# Patient Record
Sex: Male | Born: 1985 | Race: Black or African American | Hispanic: No | Marital: Single | Smoking: Current some day smoker
Health system: Southern US, Community
[De-identification: ages and names within clinical notes are randomized; demographics above are authoritative.]

## PROBLEM LIST (undated history)

## (undated) HISTORY — PX: FINGER AMPUTATION: SHX636

---
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
## 2005-02-26 ENCOUNTER — Emergency Department (HOSPITAL_COMMUNITY): Admission: EM | Admit: 2005-02-26 | Discharge: 2005-02-26 | Payer: Self-pay | Admitting: Emergency Medicine

## 2005-02-27 ENCOUNTER — Inpatient Hospital Stay (HOSPITAL_COMMUNITY): Admission: EM | Admit: 2005-02-27 | Discharge: 2005-02-28 | Payer: Self-pay | Admitting: Emergency Medicine

## 2005-04-07 ENCOUNTER — Ambulatory Visit (HOSPITAL_COMMUNITY): Admission: RE | Admit: 2005-04-07 | Discharge: 2005-04-07 | Payer: Self-pay | Admitting: Otolaryngology

## 2005-04-19 ENCOUNTER — Ambulatory Visit (HOSPITAL_COMMUNITY): Admission: RE | Admit: 2005-04-19 | Discharge: 2005-04-19 | Payer: Self-pay | Admitting: Otolaryngology

## 2005-04-19 ENCOUNTER — Ambulatory Visit (HOSPITAL_BASED_OUTPATIENT_CLINIC_OR_DEPARTMENT_OTHER): Admission: RE | Admit: 2005-04-19 | Discharge: 2005-04-19 | Payer: Self-pay | Admitting: Otolaryngology

## 2005-06-30 ENCOUNTER — Emergency Department (HOSPITAL_COMMUNITY): Admission: EM | Admit: 2005-06-30 | Discharge: 2005-06-30 | Payer: Self-pay | Admitting: Family Medicine

## 2006-09-12 ENCOUNTER — Emergency Department (HOSPITAL_COMMUNITY): Admission: EM | Admit: 2006-09-12 | Discharge: 2006-09-12 | Payer: Self-pay | Admitting: Emergency Medicine

## 2006-09-13 ENCOUNTER — Emergency Department (HOSPITAL_COMMUNITY): Admission: EM | Admit: 2006-09-13 | Discharge: 2006-09-13 | Payer: Self-pay | Admitting: Emergency Medicine

## 2007-09-19 ENCOUNTER — Emergency Department (HOSPITAL_COMMUNITY): Admission: EM | Admit: 2007-09-19 | Discharge: 2007-09-19 | Payer: Self-pay | Admitting: Emergency Medicine

## 2007-09-21 ENCOUNTER — Ambulatory Visit (HOSPITAL_COMMUNITY): Admission: RE | Admit: 2007-09-21 | Discharge: 2007-09-21 | Payer: Self-pay | Admitting: Orthopaedic Surgery

## 2007-09-26 ENCOUNTER — Emergency Department (HOSPITAL_COMMUNITY): Admission: EM | Admit: 2007-09-26 | Discharge: 2007-09-26 | Payer: Self-pay | Admitting: *Deleted

## 2008-05-04 ENCOUNTER — Emergency Department (HOSPITAL_COMMUNITY): Admission: EM | Admit: 2008-05-04 | Discharge: 2008-05-04 | Payer: Self-pay | Admitting: Emergency Medicine

## 2009-06-24 ENCOUNTER — Observation Stay (HOSPITAL_COMMUNITY): Admission: AC | Admit: 2009-06-24 | Discharge: 2009-06-25 | Payer: Self-pay

## 2011-02-20 LAB — PROTIME-INR
INR: 1 (ref 0.00–1.49)
Prothrombin Time: 12.6 seconds (ref 11.6–15.2)

## 2011-02-20 LAB — TYPE AND SCREEN
ABO/RH(D): AB POS
Antibody Screen: NEGATIVE

## 2011-02-20 LAB — POCT I-STAT, CHEM 8
BUN: 5 mg/dL — ABNORMAL LOW (ref 6–23)
Calcium, Ion: 1.1 mmol/L — ABNORMAL LOW (ref 1.12–1.32)
Chloride: 103 meq/L (ref 96–112)
Creatinine, Ser: 1.2 mg/dL (ref 0.4–1.5)
Glucose, Bld: 115 mg/dL — ABNORMAL HIGH (ref 70–99)
HCT: 48 % (ref 39.0–52.0)
Hemoglobin: 16.3 g/dL (ref 13.0–17.0)
Potassium: 3 meq/L — ABNORMAL LOW (ref 3.5–5.1)
Sodium: 140 meq/L (ref 135–145)
TCO2: 24 mmol/L (ref 0–100)

## 2011-02-20 LAB — CBC
HCT: 45.8 % (ref 39.0–52.0)
HCT: 45.9 % (ref 39.0–52.0)
Hemoglobin: 15.4 g/dL (ref 13.0–17.0)
Hemoglobin: 15.8 g/dL (ref 13.0–17.0)
MCHC: 33.7 g/dL (ref 30.0–36.0)
MCHC: 34.3 g/dL (ref 30.0–36.0)
MCV: 86 fL (ref 78.0–100.0)
MCV: 88.2 fL (ref 78.0–100.0)
Platelets: 278 10*3/uL (ref 150–400)
Platelets: 282 10*3/uL (ref 150–400)
RBC: 5.2 MIL/uL (ref 4.22–5.81)
RBC: 5.34 MIL/uL (ref 4.22–5.81)
RDW: 13.9 % (ref 11.5–15.5)
RDW: 13.9 % (ref 11.5–15.5)
WBC: 11.1 10*3/uL — ABNORMAL HIGH (ref 4.0–10.5)
WBC: 7.6 10*3/uL (ref 4.0–10.5)

## 2011-02-20 LAB — ABO/RH: ABO/RH(D): AB POS

## 2011-02-20 LAB — LACTIC ACID, PLASMA: Lactic Acid, Venous: 2.9 mmol/L — ABNORMAL HIGH (ref 0.5–2.2)
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
## 2011-08-24 LAB — CBC
HCT: 47.4
Hemoglobin: 15.8
MCHC: 33.4
MCV: 86.4
Platelets: 336
RBC: 5.49
RDW: 13.8
WBC: 8.1

## 2017-06-10 ENCOUNTER — Emergency Department (HOSPITAL_COMMUNITY): Payer: Self-pay

## 2017-06-10 ENCOUNTER — Encounter (HOSPITAL_COMMUNITY): Payer: Self-pay | Admitting: Emergency Medicine

## 2017-06-10 ENCOUNTER — Emergency Department (HOSPITAL_COMMUNITY)
Admission: EM | Admit: 2017-06-10 | Discharge: 2017-06-10 | Disposition: A | Discharge status: Other Acute Inpatient Hospital | Payer: Self-pay | Attending: Emergency Medicine | Admitting: Emergency Medicine

## 2017-06-10 DIAGNOSIS — W228XXA Striking against or struck by other objects, initial encounter: Secondary | ICD-10-CM | POA: Insufficient documentation

## 2017-06-10 DIAGNOSIS — S81801A Unspecified open wound, right lower leg, initial encounter: Secondary | ICD-10-CM | POA: Insufficient documentation

## 2017-06-10 DIAGNOSIS — Y9389 Activity, other specified: Secondary | ICD-10-CM | POA: Insufficient documentation

## 2017-06-10 DIAGNOSIS — Y929 Unspecified place or not applicable: Secondary | ICD-10-CM | POA: Insufficient documentation

## 2017-06-10 DIAGNOSIS — W3400XA Accidental discharge from unspecified firearms or gun, initial encounter: Secondary | ICD-10-CM

## 2017-06-10 DIAGNOSIS — Y248XXA Other firearm discharge, undetermined intent, initial encounter: Secondary | ICD-10-CM | POA: Insufficient documentation

## 2017-06-10 DIAGNOSIS — Y999 Unspecified external cause status: Secondary | ICD-10-CM | POA: Insufficient documentation

## 2017-06-10 LAB — I-STAT CG4 LACTIC ACID, ED: Lactic Acid, Venous: 4.43 mmol/L (ref 0.5–1.9)

## 2017-06-10 LAB — I-STAT CHEM 8, ED
BUN: 5 mg/dL — AB (ref 6–20)
CREATININE: 1 mg/dL (ref 0.61–1.24)
Calcium, Ion: 1.04 mmol/L — ABNORMAL LOW (ref 1.15–1.40)
Chloride: 103 mmol/L (ref 101–111)
Glucose, Bld: 141 mg/dL — ABNORMAL HIGH (ref 65–99)
HEMATOCRIT: 48 % (ref 39.0–52.0)
HEMOGLOBIN: 16.3 g/dL (ref 13.0–17.0)
POTASSIUM: 3 mmol/L — AB (ref 3.5–5.1)
Sodium: 139 mmol/L (ref 135–145)
TCO2: 22 mmol/L (ref 0–100)

## 2017-06-10 LAB — COMPREHENSIVE METABOLIC PANEL
ALT: 20 U/L (ref 17–63)
AST: 22 U/L (ref 15–41)
Albumin: 4.3 g/dL (ref 3.5–5.0)
Alkaline Phosphatase: 78 U/L (ref 38–126)
Anion gap: 13 (ref 5–15)
BILIRUBIN TOTAL: 0.4 mg/dL (ref 0.3–1.2)
BUN: 6 mg/dL (ref 6–20)
CHLORIDE: 103 mmol/L (ref 101–111)
CO2: 21 mmol/L — ABNORMAL LOW (ref 22–32)
CREATININE: 1.1 mg/dL (ref 0.61–1.24)
Calcium: 9.3 mg/dL (ref 8.9–10.3)
GFR calc Af Amer: >60 mL/min (ref >60)
Glucose, Bld: 144 mg/dL — ABNORMAL HIGH (ref 65–99)
Potassium: 3 mmol/L — ABNORMAL LOW (ref 3.5–5.1)
Sodium: 137 mmol/L (ref 135–145)
Total Protein: 7.3 g/dL (ref 6.5–8.1)

## 2017-06-10 LAB — PROTIME-INR
INR: 0.98
Prothrombin Time: 13 seconds (ref 11.4–15.2)

## 2017-06-10 LAB — CBC
HCT: 45.4 % (ref 39.0–52.0)
Hemoglobin: 16.2 g/dL (ref 13.0–17.0)
MCH: 30.1 pg (ref 26.0–34.0)
MCHC: 35.7 g/dL (ref 30.0–36.0)
MCV: 84.4 fL (ref 78.0–100.0)
PLATELETS: 317 10*3/uL (ref 150–400)
RBC: 5.38 MIL/uL (ref 4.22–5.81)
RDW: 13.1 % (ref 11.5–15.5)
WBC: 9.5 10*3/uL (ref 4.0–10.5)

## 2017-06-10 LAB — SAMPLE TO BLOOD BANK

## 2017-06-10 LAB — ETHANOL: Alcohol, Ethyl (B): 62 mg/dL — ABNORMAL HIGH (ref <5)

## 2017-06-10 LAB — CDS SEROLOGY

## 2017-06-10 MED ORDER — HYDROMORPHONE HCL 1 MG/ML IJ SOLN
1.0000 mg | Freq: Once | INTRAMUSCULAR | Status: AC
Start: 2017-06-10 — End: 2017-06-10
  Administered 2017-06-10: 1 mg via INTRAVENOUS
  Filled 2017-06-10: qty 1

## 2017-06-10 MED ORDER — FENTANYL CITRATE (PF) 100 MCG/2ML IJ SOLN
INTRAMUSCULAR | Status: AC
  Filled 2017-06-10: qty 2

## 2017-06-10 MED ORDER — FENTANYL CITRATE (PF) 100 MCG/2ML IJ SOLN
100.0000 ug | Freq: Once | INTRAMUSCULAR | Status: AC
  Administered 2017-06-10: 100 ug via INTRAVENOUS

## 2017-06-10 MED ORDER — LIDOCAINE-EPINEPHRINE (PF) 2 %-1:200000 IJ SOLN
10.0000 mL | Freq: Once | INTRAMUSCULAR | Status: AC
  Administered 2017-06-10: 10 mL
  Filled 2017-06-10: qty 20

## 2017-06-10 MED ORDER — MORPHINE SULFATE (PF) 4 MG/ML IV SOLN
4.0000 mg | INTRAVENOUS | Status: DC | PRN
  Administered 2017-06-10: 4 mg via INTRAVENOUS
  Filled 2017-06-10: qty 1

## 2017-06-10 MED ORDER — TETANUS-DIPHTH-ACELL PERTUSSIS 5-2.5-18.5 LF-MCG/0.5 IM SUSP
0.5000 mL | Freq: Once | INTRAMUSCULAR | Status: AC
  Administered 2017-06-10: 0.5 mL via INTRAMUSCULAR
  Filled 2017-06-10: qty 0.5

## 2017-06-10 MED ORDER — NICOTINE 21 MG/24HR TD PT24
21.0000 mg | MEDICATED_PATCH | Freq: Every day | TRANSDERMAL | Status: DC
  Administered 2017-06-10: 21 mg via TRANSDERMAL
  Filled 2017-06-10: qty 1

## 2017-06-10 MED ORDER — ONDANSETRON HCL 4 MG/2ML IJ SOLN
4.0000 mg | Freq: Once | INTRAMUSCULAR | Status: AC
  Administered 2017-06-10: 4 mg via INTRAVENOUS
  Filled 2017-06-10: qty 2

## 2017-06-10 MED ORDER — SODIUM CHLORIDE 0.9 % IV BOLUS (SEPSIS)
1000.0000 mL | Freq: Once | INTRAVENOUS | Status: AC
Start: 2017-06-10 — End: 2017-06-10
  Administered 2017-06-10: 1000 mL via INTRAVENOUS

## 2017-06-10 MED ORDER — CEFAZOLIN SODIUM-DEXTROSE 2-4 GM/100ML-% IV SOLN
2.0000 g | Freq: Once | INTRAVENOUS | Status: AC
  Administered 2017-06-10: 2 g via INTRAVENOUS
  Filled 2017-06-10: qty 100

## 2017-06-10 NOTE — ED Notes (Signed)
REPORT GIVEN TO Burnice LoganONALD OWENS, RN AT [facility]. RN informed [facility] of [facility] for [facility].

## 2017-06-10 NOTE — ED Notes (Signed)
Hand MD at bedside

## 2017-06-10 NOTE — ED Provider Notes (Signed)
Accepted by Dr[facility] at [facility] - Dr. Merlyn LotKuzma arranged transfer Pt informed and stable for transfer.   Eber HongMiller, Stryder Poitra, MD 06/10/17 [phone]
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
## 2017-06-10 NOTE — ED Notes (Signed)
Pt requesting pain meds. MD informed

## 2017-06-10 NOTE — ED Notes (Signed)
Tech, Ida, changed patient's dressings and cleaned room and changed linens in bed.
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
## 2017-06-10 NOTE — ED Notes (Signed)
RN called ortho tech 

## 2017-06-10 NOTE — Discharge Instructions (Signed)
Do not put any weight on your left foot/leg until you see Dr. Aundria Rudogers in the office in two weeks.
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
## 2017-06-10 NOTE — Progress Notes (Signed)
Orthopedic Tech Progress Note Patient Details:  [name] 12-Aug-1986 [id number]  Ortho Devices Type of Ortho Device: Post (short leg) splint, Volar splint Ortho Device/Splint Location: confirmed with floor nurse. pt needed left short leg post splint.  applied short leg post splint on pt left lower leg.    Hand surgeon requested a volar on pt left hand.  applied volar splint on pt left hand.  Pt tolerated both applications very well.  Ortho Device/Splint Interventions: Application   Alvina ChouWilliams, Dianna Ewald C 06/10/2017, 9:19 AM
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
## 2017-11-15 DIAGNOSIS — Y249XXA Unspecified firearm discharge, undetermined intent, initial encounter: Secondary | ICD-10-CM

## 2017-11-15 DIAGNOSIS — W3400XA Accidental discharge from unspecified firearms or gun, initial encounter: Secondary | ICD-10-CM

## 2017-11-15 HISTORY — DX: Accidental discharge from unspecified firearms or gun, initial encounter: W34.00XA

## 2017-11-15 HISTORY — DX: Unspecified firearm discharge, undetermined intent, initial encounter: Y24.9XXA

## 2019-05-08 ENCOUNTER — Encounter (HOSPITAL_COMMUNITY): Payer: Self-pay | Admitting: Emergency Medicine

## 2019-05-08 ENCOUNTER — Emergency Department (HOSPITAL_COMMUNITY): Payer: Self-pay

## 2019-05-08 ENCOUNTER — Other Ambulatory Visit: Payer: Self-pay

## 2019-05-08 ENCOUNTER — Emergency Department (HOSPITAL_COMMUNITY)
Admission: EM | Admit: 2019-05-08 | Discharge: 2019-05-09 | Disposition: A | Discharge status: Home / Self Care | Payer: Self-pay | Attending: Emergency Medicine | Admitting: Emergency Medicine

## 2019-05-08 DIAGNOSIS — Y9289 Other specified places as the place of occurrence of the external cause: Secondary | ICD-10-CM | POA: Insufficient documentation

## 2019-05-08 DIAGNOSIS — Y9389 Activity, other specified: Secondary | ICD-10-CM | POA: Insufficient documentation

## 2019-05-08 DIAGNOSIS — Y999 Unspecified external cause status: Secondary | ICD-10-CM | POA: Insufficient documentation

## 2019-05-08 DIAGNOSIS — S71101A Unspecified open wound, right thigh, initial encounter: Secondary | ICD-10-CM | POA: Insufficient documentation

## 2019-05-08 DIAGNOSIS — W3400XA Accidental discharge from unspecified firearms or gun, initial encounter: Secondary | ICD-10-CM | POA: Insufficient documentation

## 2019-05-08 LAB — CBC WITH DIFFERENTIAL/PLATELET
Abs Immature Granulocytes: 0.03 10*3/uL (ref 0.00–0.07)
Basophils Absolute: 0.1 10*3/uL (ref 0.0–0.1)
Basophils Relative: 1 %
Eosinophils Absolute: 0.1 10*3/uL (ref 0.0–0.5)
Eosinophils Relative: 1 %
HCT: 45.3 % (ref 39.0–52.0)
Hemoglobin: 15.8 g/dL (ref 13.0–17.0)
Immature Granulocytes: 0 %
Lymphocytes Relative: 36 %
Lymphs Abs: 3 10*3/uL (ref 0.7–4.0)
MCH: 30.3 pg (ref 26.0–34.0)
MCHC: 34.9 g/dL (ref 30.0–36.0)
MCV: 86.8 fL (ref 80.0–100.0)
Monocytes Absolute: 0.9 10*3/uL (ref 0.1–1.0)
Monocytes Relative: 11 %
Neutro Abs: 4.3 10*3/uL (ref 1.7–7.7)
Neutrophils Relative %: 51 %
Platelets: 328 10*3/uL (ref 150–400)
RBC: 5.22 MIL/uL (ref 4.22–5.81)
RDW: 13.4 % (ref 11.5–15.5)
WBC: 8.3 10*3/uL (ref 4.0–10.5)
nRBC: 0 % (ref 0.0–0.2)

## 2019-05-08 LAB — BASIC METABOLIC PANEL
Anion gap: 10 (ref 5–15)
BUN: 7 mg/dL (ref 6–20)
CO2: 23 mmol/L (ref 22–32)
Calcium: 9.6 mg/dL (ref 8.9–10.3)
Chloride: 106 mmol/L (ref 98–111)
Creatinine, Ser: 1.05 mg/dL (ref 0.61–1.24)
GFR calc Af Amer: >60 mL/min (ref >60)
GFR calc non Af Amer: >60 mL/min (ref >60)
Glucose, Bld: 109 mg/dL — ABNORMAL HIGH (ref 70–99)
Potassium: 3.7 mmol/L (ref 3.5–5.1)
Sodium: 139 mmol/L (ref 135–145)

## 2019-05-08 LAB — ETHANOL: Alcohol, Ethyl (B): 99 mg/dL — ABNORMAL HIGH (ref <10)

## 2019-05-08 MED ORDER — CEFAZOLIN SODIUM-DEXTROSE 1-4 GM/50ML-% IV SOLN
1.0000 g | Freq: Once | INTRAVENOUS | Status: AC
  Administered 2019-05-08: 1 g via INTRAVENOUS
  Filled 2019-05-08: qty 50

## 2019-05-08 MED ORDER — SODIUM CHLORIDE 0.9 % IV BOLUS
1000.0000 mL | Freq: Once | INTRAVENOUS | Status: AC
  Administered 2019-05-08: 1000 mL via INTRAVENOUS

## 2019-05-08 MED ORDER — MORPHINE SULFATE (PF) 4 MG/ML IV SOLN
4.0000 mg | Freq: Once | INTRAVENOUS | Status: AC
  Administered 2019-05-08: 4 mg via INTRAVENOUS
  Filled 2019-05-08: qty 1
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
## 2019-05-08 NOTE — ED Notes (Signed)
Levada Dy Lumpkin pts mother [phone]

## 2019-05-09 MED ORDER — IBUPROFEN 600 MG PO TABS: 600.0000 mg | ORAL_TABLET | Freq: Four times a day (QID) | ORAL | 0 refills | Status: DC | PRN

## 2019-05-09 MED ORDER — MORPHINE SULFATE (PF) 4 MG/ML IV SOLN
4.0000 mg | Freq: Once | INTRAVENOUS | Status: AC
  Administered 2019-05-09: 4 mg via INTRAVENOUS

## 2019-05-09 MED ORDER — MORPHINE SULFATE (PF) 4 MG/ML IV SOLN
INTRAVENOUS | Status: AC
  Filled 2019-05-09: qty 1

## 2019-05-09 MED ORDER — OXYCODONE-ACETAMINOPHEN 5-325 MG PO TABS: 1.0000 | ORAL_TABLET | Freq: Four times a day (QID) | ORAL | 0 refills | Status: DC | PRN

## 2019-05-10 ENCOUNTER — Telehealth: Payer: Self-pay | Admitting: *Deleted
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
## 2020-02-24 ENCOUNTER — Observation Stay (HOSPITAL_COMMUNITY)
Admission: EM | Admit: 2020-02-24 | Discharge: 2020-02-25 | Disposition: A | Discharge status: Home / Self Care | Payer: No Typology Code available for payment source | Attending: Orthopedic Surgery | Admitting: Orthopedic Surgery

## 2020-02-24 ENCOUNTER — Emergency Department (HOSPITAL_COMMUNITY): Payer: No Typology Code available for payment source

## 2020-02-24 ENCOUNTER — Encounter (HOSPITAL_COMMUNITY)
Admission: EM | Disposition: A | Discharge status: Home / Self Care | Payer: Self-pay | Source: Home / Self Care | Attending: Emergency Medicine

## 2020-02-24 ENCOUNTER — Encounter (HOSPITAL_COMMUNITY): Payer: Self-pay | Admitting: Emergency Medicine

## 2020-02-24 ENCOUNTER — Other Ambulatory Visit: Payer: Self-pay

## 2020-02-24 DIAGNOSIS — F17200 Nicotine dependence, unspecified, uncomplicated: Secondary | ICD-10-CM | POA: Diagnosis not present

## 2020-02-24 DIAGNOSIS — Z79899 Other long term (current) drug therapy: Secondary | ICD-10-CM | POA: Diagnosis not present

## 2020-02-24 DIAGNOSIS — S80211A Abrasion, right knee, initial encounter: Secondary | ICD-10-CM | POA: Insufficient documentation

## 2020-02-24 DIAGNOSIS — S0081XA Abrasion of other part of head, initial encounter: Secondary | ICD-10-CM | POA: Insufficient documentation

## 2020-02-24 DIAGNOSIS — M25531 Pain in right wrist: Secondary | ICD-10-CM

## 2020-02-24 DIAGNOSIS — T1490XA Injury, unspecified, initial encounter: Secondary | ICD-10-CM

## 2020-02-24 DIAGNOSIS — S60512A Abrasion of left hand, initial encounter: Secondary | ICD-10-CM | POA: Insufficient documentation

## 2020-02-24 DIAGNOSIS — S82851B Displaced trimalleolar fracture of right lower leg, initial encounter for open fracture type I or II: Principal | ICD-10-CM | POA: Insufficient documentation

## 2020-02-24 DIAGNOSIS — Z20822 Contact with and (suspected) exposure to covid-19: Secondary | ICD-10-CM | POA: Insufficient documentation

## 2020-02-24 DIAGNOSIS — S80212A Abrasion, left knee, initial encounter: Secondary | ICD-10-CM | POA: Diagnosis not present

## 2020-02-24 DIAGNOSIS — S82891B Other fracture of right lower leg, initial encounter for open fracture type I or II: Secondary | ICD-10-CM | POA: Diagnosis present

## 2020-02-24 HISTORY — PX: EXTERNAL FIXATION LEG: SHX1549

## 2020-02-24 LAB — I-STAT CHEM 8, ED
BUN: 9 mg/dL (ref 6–20)
Calcium, Ion: 1.07 mmol/L — ABNORMAL LOW (ref 1.15–1.40)
Chloride: 105 mmol/L (ref 98–111)
Creatinine, Ser: 1.1 mg/dL (ref 0.61–1.24)
Glucose, Bld: 110 mg/dL — ABNORMAL HIGH (ref 70–99)
HCT: 48 % (ref 39.0–52.0)
Hemoglobin: 16.3 g/dL (ref 13.0–17.0)
Potassium: 3.8 mmol/L (ref 3.5–5.1)
Sodium: 139 mmol/L (ref 135–145)
TCO2: 24 mmol/L (ref 22–32)

## 2020-02-24 LAB — URINALYSIS, ROUTINE W REFLEX MICROSCOPIC
Bilirubin Urine: NEGATIVE
Glucose, UA: NEGATIVE mg/dL
Hgb urine dipstick: NEGATIVE
Ketones, ur: NEGATIVE mg/dL
Leukocytes,Ua: NEGATIVE
Nitrite: NEGATIVE
Protein, ur: NEGATIVE mg/dL
Specific Gravity, Urine: 1.033 — ABNORMAL HIGH (ref 1.005–1.030)
pH: 5 (ref 5.0–8.0)

## 2020-02-24 LAB — COMPREHENSIVE METABOLIC PANEL
ALT: 13 U/L (ref 0–44)
AST: 17 U/L (ref 15–41)
Albumin: 3.8 g/dL (ref 3.5–5.0)
Alkaline Phosphatase: 87 U/L (ref 38–126)
Anion gap: 16 — ABNORMAL HIGH (ref 5–15)
BUN: 8 mg/dL (ref 6–20)
CO2: 18 mmol/L — ABNORMAL LOW (ref 22–32)
Calcium: 8.9 mg/dL (ref 8.9–10.3)
Chloride: 102 mmol/L (ref 98–111)
Creatinine, Ser: 0.91 mg/dL (ref 0.61–1.24)
GFR calc Af Amer: >60 mL/min (ref >60)
GFR calc non Af Amer: >60 mL/min (ref >60)
Glucose, Bld: 114 mg/dL — ABNORMAL HIGH (ref 70–99)
Potassium: 3.9 mmol/L (ref 3.5–5.1)
Sodium: 136 mmol/L (ref 135–145)
Total Bilirubin: 0.6 mg/dL (ref 0.3–1.2)
Total Protein: 6.8 g/dL (ref 6.5–8.1)

## 2020-02-24 LAB — CBC
HCT: 44.6 % (ref 39.0–52.0)
Hemoglobin: 15.2 g/dL (ref 13.0–17.0)
MCH: 29.6 pg (ref 26.0–34.0)
MCHC: 34.1 g/dL (ref 30.0–36.0)
MCV: 86.9 fL (ref 80.0–100.0)
Platelets: 410 10*3/uL — ABNORMAL HIGH (ref 150–400)
RBC: 5.13 MIL/uL (ref 4.22–5.81)
RDW: 13.2 % (ref 11.5–15.5)
WBC: 8.3 10*3/uL (ref 4.0–10.5)
nRBC: 0 % (ref 0.0–0.2)

## 2020-02-24 LAB — ETHANOL: Alcohol, Ethyl (B): 147 mg/dL — ABNORMAL HIGH (ref <10)

## 2020-02-24 LAB — RESPIRATORY PANEL BY RT PCR (FLU A&B, COVID)
Influenza A by PCR: NEGATIVE
Influenza B by PCR: NEGATIVE
SARS Coronavirus 2 by RT PCR: NEGATIVE

## 2020-02-24 LAB — SAMPLE TO BLOOD BANK

## 2020-02-24 LAB — PROTIME-INR
INR: 1 (ref 0.8–1.2)
Prothrombin Time: 13.1 seconds (ref 11.4–15.2)

## 2020-02-24 SURGERY — EXTERNAL FIXATION, LOWER EXTREMITY
Anesthesia: Regional | Site: Leg Lower | Laterality: Right

## 2020-02-24 MED ORDER — FENTANYL CITRATE (PF) 100 MCG/2ML IJ SOLN
50.0000 ug | Freq: Once | INTRAMUSCULAR | Status: AC
  Administered 2020-02-24: 50 ug via INTRAVENOUS

## 2020-02-24 MED ORDER — ROCURONIUM BROMIDE 10 MG/ML (PF) SYRINGE
PREFILLED_SYRINGE | INTRAVENOUS | Status: AC
  Filled 2020-02-24: qty 10

## 2020-02-24 MED ORDER — GABAPENTIN 300 MG PO CAPS: 300.0000 mg | ORAL_CAPSULE | Freq: Three times a day (TID) | ORAL | 0 refills | Status: AC | PRN

## 2020-02-24 MED ORDER — DEXAMETHASONE SODIUM PHOSPHATE 10 MG/ML IJ SOLN
INTRAMUSCULAR | Status: AC
  Filled 2020-02-24: qty 1

## 2020-02-24 MED ORDER — ACETAMINOPHEN 500 MG PO TABS: 1000.0000 mg | ORAL_TABLET | Freq: Three times a day (TID) | ORAL | 0 refills | Status: AC

## 2020-02-24 MED ORDER — IOHEXOL 300 MG/ML  SOLN
100.0000 mL | Freq: Once | INTRAMUSCULAR | Status: AC | PRN
  Administered 2020-02-24: 100 mL via INTRAVENOUS

## 2020-02-24 MED ORDER — LACTATED RINGERS IV SOLN: INTRAVENOUS | Status: DC | PRN

## 2020-02-24 MED ORDER — FENTANYL CITRATE (PF) 100 MCG/2ML IJ SOLN
100.0000 ug | Freq: Once | INTRAMUSCULAR | Status: AC
  Administered 2020-02-24: 100 ug via INTRAVENOUS

## 2020-02-24 MED ORDER — CEFAZOLIN SODIUM-DEXTROSE 2-4 GM/100ML-% IV SOLN
2.0000 g | INTRAVENOUS | Status: AC
  Administered 2020-02-24: 2 g via INTRAVENOUS

## 2020-02-24 MED ORDER — CEPHALEXIN 500 MG PO CAPS: 500.0000 mg | ORAL_CAPSULE | Freq: Four times a day (QID) | ORAL | 0 refills | Status: DC

## 2020-02-24 MED ORDER — KETAMINE HCL 50 MG/5ML IJ SOSY: 80.0000 mg | PREFILLED_SYRINGE | Freq: Once | INTRAMUSCULAR | Status: DC

## 2020-02-24 MED ORDER — FENTANYL CITRATE (PF) 100 MCG/2ML IJ SOLN
INTRAMUSCULAR | Status: AC
  Filled 2020-02-24: qty 2

## 2020-02-24 MED ORDER — SUCCINYLCHOLINE CHLORIDE 200 MG/10ML IV SOSY
PREFILLED_SYRINGE | INTRAVENOUS | Status: AC
  Filled 2020-02-24: qty 10

## 2020-02-24 MED ORDER — OXYCODONE HCL 5 MG PO TABS: 5.0000 mg | ORAL_TABLET | ORAL | 0 refills | Status: AC | PRN

## 2020-02-24 MED ORDER — PROPOFOL 10 MG/ML IV BOLUS
INTRAVENOUS | Status: AC
  Filled 2020-02-24: qty 20

## 2020-02-24 MED ORDER — ONDANSETRON HCL 4 MG PO TABS: 4.0000 mg | ORAL_TABLET | Freq: Three times a day (TID) | ORAL | 0 refills | Status: DC | PRN

## 2020-02-24 MED ORDER — LIDOCAINE 2% (20 MG/ML) 5 ML SYRINGE
INTRAMUSCULAR | Status: AC
  Filled 2020-02-24: qty 5

## 2020-02-24 MED ORDER — ASPIRIN EC 81 MG PO TBEC: 81.0000 mg | DELAYED_RELEASE_TABLET | Freq: Two times a day (BID) | ORAL | 0 refills | Status: DC

## 2020-02-24 MED ORDER — FENTANYL CITRATE (PF) 250 MCG/5ML IJ SOLN
INTRAMUSCULAR | Status: AC
  Filled 2020-02-24: qty 5

## 2020-02-24 MED ORDER — MIDAZOLAM HCL 2 MG/2ML IJ SOLN
INTRAMUSCULAR | Status: AC
  Filled 2020-02-24: qty 2

## 2020-02-24 MED ORDER — TETANUS-DIPHTH-ACELL PERTUSSIS 5-2.5-18.5 LF-MCG/0.5 IM SUSP: 0.5000 mL | Freq: Once | INTRAMUSCULAR | Status: DC

## 2020-02-24 SURGICAL SUPPLY — 67 items
BANDAGE ESMARK 6X9 LF (GAUZE/BANDAGES/DRESSINGS) ×1 IMPLANT
BIT DRILL CANN 2.7 (BIT) ×2
BIT DRILL SRG 2.7XCANN AO CPLG (BIT) IMPLANT
BIT DRL SRG 2.7XCANN AO CPLNG (BIT) ×1
BNDG CMPR 9X6 STRL LF SNTH (GAUZE/BANDAGES/DRESSINGS) ×1
BNDG COHESIVE 6X5 TAN STRL LF (GAUZE/BANDAGES/DRESSINGS) IMPLANT
BNDG ELASTIC 4X5.8 VLCR STR LF (GAUZE/BANDAGES/DRESSINGS) ×2 IMPLANT
BNDG ELASTIC 6X5.8 VLCR STR LF (GAUZE/BANDAGES/DRESSINGS) ×2 IMPLANT
BNDG ESMARK 6X9 LF (GAUZE/BANDAGES/DRESSINGS) ×2
BNDG GAUZE ELAST 4 BULKY (GAUZE/BANDAGES/DRESSINGS) ×4 IMPLANT
BRUSH SCRUB EZ PLAIN DRY (MISCELLANEOUS) ×3 IMPLANT
CLEANING WIRE 1.4 DIA (INSTRUMENTS) ×4
COVER SURGICAL LIGHT HANDLE (MISCELLANEOUS) ×3 IMPLANT
COVER WAND RF STERILE (DRAPES) ×1 IMPLANT
CUFF TOURN SGL QUICK 34 (TOURNIQUET CUFF) ×2
CUFF TRNQT CYL 34X4.125X (TOURNIQUET CUFF) IMPLANT
DRAPE C-ARM 42X72 X-RAY (DRAPES) ×2 IMPLANT
DRAPE C-ARMOR (DRAPES) ×2 IMPLANT
DRAPE U-SHAPE 47X51 STRL (DRAPES) ×2 IMPLANT
DRILL 2.6X122MM WL AO SHAFT (BIT) ×1 IMPLANT
DRSG EMULSION OIL 3X3 NADH (GAUZE/BANDAGES/DRESSINGS) ×2 IMPLANT
ELECT REM PT RETURN 9FT ADLT (ELECTROSURGICAL) ×2
ELECTRODE REM PT RTRN 9FT ADLT (ELECTROSURGICAL) ×1 IMPLANT
GAUZE SPONGE 4X4 12PLY STRL (GAUZE/BANDAGES/DRESSINGS) ×2 IMPLANT
GAUZE XEROFORM 5X9 LF (GAUZE/BANDAGES/DRESSINGS) ×2 IMPLANT
GLOVE BIO SURGEON STRL SZ7.5 (GLOVE) ×2 IMPLANT
GLOVE BIO SURGEON STRL SZ8 (GLOVE) ×2 IMPLANT
GLOVE BIOGEL PI IND STRL 6.5 (GLOVE) IMPLANT
GLOVE BIOGEL PI IND STRL 8 (GLOVE) ×2 IMPLANT
GLOVE BIOGEL PI INDICATOR 6.5 (GLOVE) ×1
GLOVE BIOGEL PI INDICATOR 8 (GLOVE) ×2
GOWN STRL REUS W/ TWL LRG LVL3 (GOWN DISPOSABLE) ×2 IMPLANT
GOWN STRL REUS W/ TWL XL LVL3 (GOWN DISPOSABLE) ×1 IMPLANT
GOWN STRL REUS W/TWL LRG LVL3 (GOWN DISPOSABLE) ×4
GOWN STRL REUS W/TWL XL LVL3 (GOWN DISPOSABLE)
GUIDE WIRE UNTHRD 1.4X150 (Wire) ×2 IMPLANT
GUIDEWIRE UNTHRD 1.4X150 (Wire) IMPLANT
IMPL TIGHTROP W/DRV K-LESS (Anchor) IMPLANT
IMPLANT TIGHTROPE W/DRV K-LESS (Anchor) ×2 IMPLANT
KIT BASIN OR (CUSTOM PROCEDURE TRAY) ×2 IMPLANT
KIT TURNOVER KIT B (KITS) ×2 IMPLANT
MANIFOLD NEPTUNE II (INSTRUMENTS) ×2 IMPLANT
NS IRRIG 1000ML POUR BTL (IV SOLUTION) ×2 IMPLANT
PACK ORTHO EXTREMITY (CUSTOM PROCEDURE TRAY) ×2 IMPLANT
PAD ARMBOARD 7.5X6 YLW CONV (MISCELLANEOUS) ×4 IMPLANT
PAD CAST 4YDX4 CTTN HI CHSV (CAST SUPPLIES) IMPLANT
PADDING CAST COTTON 4X4 STRL (CAST SUPPLIES) ×6
PADDING CAST COTTON 6X4 STRL (CAST SUPPLIES) ×3 IMPLANT
PLATE 1/3 TUBULAR 7H (Plate) ×1 IMPLANT
SCREW CANC 2.5XFT HEX12X4X (Screw) IMPLANT
SCREW CANCELLOUS 4.0X12MM (Screw) ×2 IMPLANT
SCREW CANCELLOUS 4.0X14 (Screw) ×1 IMPLANT
SCREW CANN ASNIS III 4.0X40MM (Screw) ×2 IMPLANT
SCREW CANN TI L-THRD 4.0X28 (Screw) ×2 IMPLANT
SCREW CORTEX ST MATTA 3.5X12MM (Screw) ×1 IMPLANT
SCREW CORTEX ST MATTA 3.5X14 (Screw) ×2 IMPLANT
SPONGE LAP 18X18 RF (DISPOSABLE) IMPLANT
STOCKINETTE IMPERVIOUS LG (DRAPES) IMPLANT
SUCTION FRAZIER HANDLE 10FR (MISCELLANEOUS) ×2
SUCTION TUBE FRAZIER 10FR DISP (MISCELLANEOUS) IMPLANT
SUT ETHILON 3 0 PS 1 (SUTURE) ×2 IMPLANT
SUT VIC AB 0 CT1 36 (SUTURE) ×1 IMPLANT
TOWEL GREEN STERILE (TOWEL DISPOSABLE) ×3 IMPLANT
TUBE CONNECTING 20X1/4 (TUBING) ×1 IMPLANT
UNDERPAD 30X30 (UNDERPADS AND DIAPERS) ×2 IMPLANT
WIRE CLEANING 1.4 DIA (INSTRUMENTS) IMPLANT
YANKAUER SUCT BULB TIP NO VENT (SUCTIONS) ×1 IMPLANT

## 2020-02-24 NOTE — ED Notes (Signed)
Report given to CRNA .  
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
## 2020-02-24 NOTE — ED Notes (Signed)
Pt to Xray and CT
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
## 2020-02-24 NOTE — ED Notes (Signed)
Patient girlfriend Kenney Houseman calling asking for an update or to have patient call her back which ever is quicker she states  [phone]

## 2020-02-24 NOTE — ED Notes (Signed)
Mother and patient updated. Pt provided oral swabs.

## 2020-02-24 NOTE — Discharge Instructions (Signed)
It is very important for you to Elevate your leg - Toes above nose as much as possible to reduce pain / swelling.  Weight Bearing:  Non weight bearing affected leg.  Do not smoke.  Smoking increases your risk of infection and decreases your ability to heal.  Diet: As you were doing prior to hospitalization   Shower:  If You have a splint on, leave the splint in place and keep the splint dry with a plastic bag.  Dressing:  We will change your bandages during your first follow-up appointment.  You may loosen and re-apply ace wrap if it feels too tight.    Activity:  Increase activity slowly as tolerated, but follow the weight bearing instructions below.  The rules on driving is that you can not be taking narcotics while you drive, and you must feel in control of the vehicle.    To prevent constipation:  Narcotic medicines cause constipation.  Wean these as soon as is appropriate.   You may use a stool softener such as -  Colace (over the counter) 100 mg by mouth twice a day  Drink plenty of fluids (prune juice may be helpful) and high fiber foods Miralax (over the counter) for constipation as needed.    Itching:  If you experience itching with your medications, try taking only a single pain pill, or even half a pain pill at a time.  You can also use benadryl over the counter for itching or also to help with sleep.   Precautions:  If you experience chest pain or shortness of breath - call 911 immediately for transfer to the hospital emergency department!!  If you develop a fever greater that 101 F, purulent drainage from wound, increased redness or drainage from wound, or calf pain -- Call the office at [phone]                                                 Follow- Up Appointment:  Please call for an appointment to be seen in 1 weeks Sam Rayburn - [phone]

## 2020-02-24 NOTE — ED Notes (Signed)
Pt's belongings given to mother with permission from pt.
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
## 2020-02-24 NOTE — Anesthesia Preprocedure Evaluation (Addendum)
Anesthesia Evaluation  Patient identified by MRN, date of birth, ID band Patient awake    Reviewed: Allergy & Precautions, H&P , NPO status , Patient's Chart, lab work & pertinent test results  Airway Mallampati: II  TM Distance: >3 FB Neck ROM: Full    Dental  (+) Teeth Intact, Dental Advisory Given Some molars missing. :   Pulmonary Current Smoker,    breath sounds clear to auscultation       Cardiovascular negative cardio ROS   Rhythm:regular Rate:Normal     Neuro/Psych    GI/Hepatic   Endo/Other    Renal/GU      Musculoskeletal   Abdominal   Peds  Hematology   Anesthesia Other Findings   Reproductive/Obstetrics                           Anesthesia Physical Anesthesia Plan  ASA: II and emergent  Anesthesia Plan: General and Regional   Post-op Pain Management: GA combined w/ Regional for post-op pain   Induction: Intravenous  PONV Risk Score and Plan: 1 and Treatment may vary due to age or medical condition, Ondansetron, Dexamethasone and Midazolam  Airway Management Planned: Oral ETT  Additional Equipment:   Intra-op Plan:   Post-operative Plan: Extubation in OR  Informed Consent: I have reviewed the patients History and Physical, chart, labs and discussed the procedure including the risks, benefits and alternatives for the proposed anesthesia with the patient or authorized representative who has indicated his/her understanding and acceptance.       Plan Discussed with: Anesthesiologist, CRNA and Surgeon  Anesthesia Plan Comments:       Anesthesia Quick Evaluation

## 2020-02-25 ENCOUNTER — Encounter (HOSPITAL_COMMUNITY): Payer: Self-pay | Admitting: Orthopedic Surgery

## 2020-02-25 ENCOUNTER — Emergency Department (HOSPITAL_COMMUNITY): Payer: No Typology Code available for payment source | Admitting: Anesthesiology

## 2020-02-25 ENCOUNTER — Observation Stay (HOSPITAL_COMMUNITY): Payer: No Typology Code available for payment source

## 2020-02-25 DIAGNOSIS — S82891B Other fracture of right lower leg, initial encounter for open fracture type I or II: Secondary | ICD-10-CM | POA: Diagnosis present

## 2020-02-25 LAB — CBC
HCT: 43.1 % (ref 39.0–52.0)
Hemoglobin: 14.8 g/dL (ref 13.0–17.0)
MCH: 29.5 pg (ref 26.0–34.0)
MCHC: 34.3 g/dL (ref 30.0–36.0)
MCV: 85.9 fL (ref 80.0–100.0)
Platelets: 371 10*3/uL (ref 150–400)
RBC: 5.02 MIL/uL (ref 4.22–5.81)
RDW: 13.3 % (ref 11.5–15.5)
WBC: 11.3 10*3/uL — ABNORMAL HIGH (ref 4.0–10.5)
nRBC: 0 % (ref 0.0–0.2)

## 2020-02-25 LAB — LACTIC ACID, PLASMA: Lactic Acid, Venous: 1.8 mmol/L (ref 0.5–1.9)

## 2020-02-25 LAB — CREATININE, SERUM
Creatinine, Ser: 0.83 mg/dL (ref 0.61–1.24)
GFR calc Af Amer: >60 mL/min (ref >60)
GFR calc non Af Amer: >60 mL/min (ref >60)

## 2020-02-25 MED ORDER — POLYETHYLENE GLYCOL 3350 17 G PO PACK: 17.0000 g | PACK | Freq: Every day | ORAL | Status: DC | PRN

## 2020-02-25 MED ORDER — METHOCARBAMOL 500 MG PO TABS: 500.0000 mg | ORAL_TABLET | Freq: Four times a day (QID) | ORAL | Status: DC | PRN

## 2020-02-25 MED ORDER — METOCLOPRAMIDE HCL 5 MG/ML IJ SOLN: 5.0000 mg | Freq: Three times a day (TID) | INTRAMUSCULAR | Status: DC | PRN

## 2020-02-25 MED ORDER — ACETAMINOPHEN 325 MG PO TABS: 325.0000 mg | ORAL_TABLET | Freq: Four times a day (QID) | ORAL | Status: DC | PRN

## 2020-02-25 MED ORDER — CEPHALEXIN 500 MG PO CAPS: 500.0000 mg | ORAL_CAPSULE | Freq: Three times a day (TID) | ORAL | 0 refills | Status: AC

## 2020-02-25 MED ORDER — ONDANSETRON HCL 4 MG/2ML IJ SOLN: 4.0000 mg | Freq: Four times a day (QID) | INTRAMUSCULAR | Status: DC | PRN

## 2020-02-25 MED ORDER — PHENYLEPHRINE HCL (PRESSORS) 10 MG/ML IV SOLN
INTRAVENOUS | Status: DC | PRN
  Administered 2020-02-25: 40 ug via INTRAVENOUS

## 2020-02-25 MED ORDER — FENTANYL CITRATE (PF) 250 MCG/5ML IJ SOLN
INTRAMUSCULAR | Status: DC | PRN
  Administered 2020-02-25 (×2): 50 ug via INTRAVENOUS

## 2020-02-25 MED ORDER — CEFAZOLIN SODIUM-DEXTROSE 2-4 GM/100ML-% IV SOLN
INTRAVENOUS | Status: AC
  Filled 2020-02-25: qty 100

## 2020-02-25 MED ORDER — GABAPENTIN 300 MG PO CAPS
300.0000 mg | ORAL_CAPSULE | Freq: Three times a day (TID) | ORAL | Status: DC
  Administered 2020-02-25: 300 mg via ORAL
  Filled 2020-02-25: qty 1

## 2020-02-25 MED ORDER — MIDAZOLAM HCL 2 MG/2ML IJ SOLN
INTRAMUSCULAR | Status: DC | PRN
  Administered 2020-02-25: 2 mg via INTRAVENOUS

## 2020-02-25 MED ORDER — BUPIVACAINE HCL (PF) 0.5 % IJ SOLN
INTRAMUSCULAR | Status: DC | PRN
  Administered 2020-02-25: 15 mL via PERINEURAL
  Administered 2020-02-25: 25 mL via PERINEURAL

## 2020-02-25 MED ORDER — KETOROLAC TROMETHAMINE 15 MG/ML IJ SOLN
15.0000 mg | Freq: Four times a day (QID) | INTRAMUSCULAR | Status: DC
  Administered 2020-02-25 (×3): 15 mg via INTRAVENOUS
  Filled 2020-02-25 (×3): qty 1

## 2020-02-25 MED ORDER — ACETAMINOPHEN 500 MG PO TABS
1000.0000 mg | ORAL_TABLET | Freq: Three times a day (TID) | ORAL | Status: DC
  Administered 2020-02-25 (×2): 1000 mg via ORAL
  Filled 2020-02-25 (×2): qty 2

## 2020-02-25 MED ORDER — LIDOCAINE HCL (CARDIAC) PF 100 MG/5ML IV SOSY
PREFILLED_SYRINGE | INTRAVENOUS | Status: DC | PRN
  Administered 2020-02-25: 60 mg via INTRATRACHEAL

## 2020-02-25 MED ORDER — DIPHENHYDRAMINE HCL 12.5 MG/5ML PO ELIX: 12.5000 mg | ORAL_SOLUTION | ORAL | Status: DC | PRN

## 2020-02-25 MED ORDER — ONDANSETRON HCL 4 MG/2ML IJ SOLN
INTRAMUSCULAR | Status: DC | PRN
  Administered 2020-02-25: 4 mg via INTRAVENOUS

## 2020-02-25 MED ORDER — CEFAZOLIN SODIUM-DEXTROSE 2-3 GM-%(50ML) IV SOLR
INTRAVENOUS | Status: DC | PRN
  Administered 2020-02-25: 2 g via INTRAVENOUS

## 2020-02-25 MED ORDER — LACTATED RINGERS IV SOLN: INTRAVENOUS | Status: DC

## 2020-02-25 MED ORDER — HYDROMORPHONE HCL 1 MG/ML IJ SOLN: 0.5000 mg | INTRAMUSCULAR | Status: DC | PRN

## 2020-02-25 MED ORDER — ONDANSETRON HCL 4 MG PO TABS: 4.0000 mg | ORAL_TABLET | Freq: Four times a day (QID) | ORAL | Status: DC | PRN

## 2020-02-25 MED ORDER — OXYCODONE HCL 5 MG PO TABS
5.0000 mg | ORAL_TABLET | ORAL | Status: DC | PRN
  Administered 2020-02-25: 10 mg via ORAL
  Filled 2020-02-25: qty 2

## 2020-02-25 MED ORDER — ROCURONIUM 10MG/ML (10ML) SYRINGE FOR MEDFUSION PUMP - OPTIME
INTRAVENOUS | Status: DC | PRN
  Administered 2020-02-25: 100 mg via INTRAVENOUS

## 2020-02-25 MED ORDER — DOCUSATE SODIUM 100 MG PO CAPS
100.0000 mg | ORAL_CAPSULE | Freq: Two times a day (BID) | ORAL | Status: DC
  Filled 2020-02-25: qty 1

## 2020-02-25 MED ORDER — PROPOFOL 10 MG/ML IV BOLUS
INTRAVENOUS | Status: DC | PRN
  Administered 2020-02-25: 180 mg via INTRAVENOUS

## 2020-02-25 MED ORDER — FENTANYL CITRATE (PF) 100 MCG/2ML IJ SOLN: 25.0000 ug | INTRAMUSCULAR | Status: DC | PRN

## 2020-02-25 MED ORDER — PHENYLEPHRINE 40 MCG/ML (10ML) SYRINGE FOR IV PUSH (FOR BLOOD PRESSURE SUPPORT)
PREFILLED_SYRINGE | INTRAVENOUS | Status: AC
  Filled 2020-02-25: qty 10

## 2020-02-25 MED ORDER — DEXAMETHASONE SODIUM PHOSPHATE 10 MG/ML IJ SOLN
INTRAMUSCULAR | Status: DC | PRN
  Administered 2020-02-25: 10 mg via INTRAVENOUS

## 2020-02-25 MED ORDER — METHOCARBAMOL 1000 MG/10ML IJ SOLN: 500.0000 mg | Freq: Four times a day (QID) | INTRAVENOUS | Status: DC | PRN

## 2020-02-25 MED ORDER — SODIUM CHLORIDE 0.9 % IR SOLN
Status: DC | PRN
  Administered 2020-02-25 (×2): 1000 mL

## 2020-02-25 MED ORDER — ENOXAPARIN SODIUM 40 MG/0.4ML ~~LOC~~ SOLN: 40.0000 mg | Freq: Every day | SUBCUTANEOUS | Status: DC

## 2020-02-25 MED ORDER — SUGAMMADEX SODIUM 200 MG/2ML IV SOLN
INTRAVENOUS | Status: DC | PRN
  Administered 2020-02-25: 200 mg via INTRAVENOUS

## 2020-02-25 MED ORDER — OXYCODONE HCL 5 MG PO TABS: 5.0000 mg | ORAL_TABLET | Freq: Once | ORAL | Status: DC | PRN

## 2020-02-25 MED ORDER — METOCLOPRAMIDE HCL 5 MG PO TABS: 5.0000 mg | ORAL_TABLET | Freq: Three times a day (TID) | ORAL | Status: DC | PRN

## 2020-02-25 MED ORDER — CEFAZOLIN SODIUM-DEXTROSE 2-4 GM/100ML-% IV SOLN
2.0000 g | Freq: Three times a day (TID) | INTRAVENOUS | Status: DC
  Administered 2020-02-25 (×2): 2 g via INTRAVENOUS
  Filled 2020-02-25 (×2): qty 100

## 2020-02-25 MED ORDER — OXYCODONE HCL 5 MG/5ML PO SOLN: 5.0000 mg | Freq: Once | ORAL | Status: DC | PRN

## 2020-02-25 NOTE — Anesthesia Procedure Notes (Signed)
Anesthesia Regional Block: Adductor canal block   Pre-Anesthetic Checklist: ,, timeout performed, Correct Patient, Correct Site, Correct Laterality, Correct Procedure, Correct Position, site marked, Risks and benefits discussed,  Surgical consent,  Pre-op evaluation,  At surgeon's request and post-op pain management  Laterality: Right  Prep: chloraprep       Needles:  Injection technique: Single-shot  Needle Type: Echogenic Needle     Needle Length: 9cm  Needle Gauge: 21     Additional Needles:   Narrative:  Start time: 02/25/2020 12:04 AM End time: 02/25/2020 12:07 AM Injection made incrementally with aspirations every 5 mL.  Performed by: Personally  Anesthesiologist: Achille Rich, MD  Additional Notes: Pt tolerated the procedure well.

## 2020-02-25 NOTE — Plan of Care (Signed)
  Problem: Activity: Goal: Risk for activity intolerance will decrease Outcome: Completed/Met  Up in room with walker, tolerating well Problem: Pain Managment: Goal: General experience of comfort will improve Outcome: Completed/Met  Wrist & head pain controlled with scheduled tylenol & toradol Problem: Safety: Goal: Ability to remain free from injury will improve Outcome: Completed/Met   Problem: Skin Integrity: Goal: Risk for impaired skin integrity will decrease Outcome: Completed/Met

## 2020-02-25 NOTE — Progress Notes (Addendum)
    Subjective: Patient reports pain as mild-nerve block still fully effective.  Tolerating diet.  Urinating.  Not yet mobilized.  Right wrist sore.  Objective:   VITALS:   Vitals:   02/25/20 0208 02/25/20 0230 02/25/20 0437 02/25/20 0604  BP: 138/87 134/89 132/89 123/82  Pulse: 91 93 71 66  Resp: 13 15 16 16   Temp: 98.2 F (36.8 C) 97.7 F (36.5 C) 97.7 F (36.5 C) 98.1 F (36.7 C)  TempSrc:  Oral Oral Oral  SpO2: 96% 100% 97% 98%  Weight:      Height:       CBC Latest Ref Rng & Units 02/25/2020 02/24/2020 02/24/2020  WBC 4.0 - 10.5 K/uL 11.3(H) 8.3 -  Hemoglobin 13.0 - 17.0 g/dL 04/25/2020 38.1 01.7  Hematocrit 39.0 - 52.0 % 43.1 44.6 48.0  Platelets 150 - 400 K/uL 371 410(H) -   BMP Latest Ref Rng & Units 02/25/2020 02/24/2020 02/24/2020  Glucose 70 - 99 mg/dL - 04/25/2020) 258(N)  BUN 6 - 20 mg/dL - 8 9  Creatinine 277(O - 1.24 mg/dL 2.42 3.53 6.14  Sodium 135 - 145 mmol/L - 136 139  Potassium 3.5 - 5.1 mmol/L - 3.9 3.8  Chloride 98 - 111 mmol/L - 102 105  CO2 22 - 32 mmol/L - 18(L) -  Calcium 8.9 - 10.3 mg/dL - 8.9 -   Intake/Output      04/11 0701 - 04/12 0700 04/12 0701 - 04/13 0700   I.V. (mL/kg) 927.8 (12)    Total Intake(mL/kg) 927.8 (12)    Urine (mL/kg/hr) 600    Blood 30    Total Output 630    Net +297.8            Physical Exam: General: NAD.  Upright in bed.  Calm, conversant. Resp: No increased wob Cardio: regular rate and rhythm ABD soft Neurologically intact MSK RLE: Splint intact and in good condition.  Toes warm.  Motion and sensation still lacking with recent nerve block.  RUE: Right wrist mild to moderate discomfort with range of motion that is intact.  Pain is along the radiocarpal joint.  No severe point bony tenderness.  Neurovascularly intact.  Assessment: 1 Day Post-Op  S/P Procedure(s) (LRB): ORIF RIGHT LOWER LEG (Right) by Dr. 5/13. Murphy on 02/25/2020  Active Problems:   Open right ankle fracture   Open right trimalleolar ankle  fracture/dislocation Status post irrigation, debridement, ORIF Doing well postop day 1 Pain minimal as nerve block is still fully effective Tolerating diet  Right wrist pain. Will check an x-ray.  If negative, okay for removable wrist brace.  Plan: Up with therapy Continue Ancef for open fracture.  Keflex after discharge. Incentive Spirometry Smoking cessation Elevate Apply ice as needed  Weightbearing: NWB RLE Insicional and dressing care: Dressings left intact until follow-up Orthopedic device(s): Splint Showering: Keep dressing dry VTE prophylaxis: Lovenox while inpatient.  Aspirin 81 mg twice daily after discharge.   SCDs, ambulation Pain control: Elevate right leg.  Continue current regimen.  Nerve block fully effective so far. Follow - up plan: 1 to 2 weeks in the office with Dr. 04/26/2020 information:  Adora Fridge MD, Margarita Rana PA-C  Dispo: Therapy evaluation this morning to ensure safe mobilization and to establish DME needs.  Likely will be able to discharge home today.  X-rays right wrist pending.    Aquilla Hacker III, PA-C 02/25/2020, 7:49 AM

## 2020-02-25 NOTE — Progress Notes (Signed)
Pt seen for PT Evaluation (formal note to follow). Moving well with rolling walker and crutches, pt prefers to d/c home with rolling walker (RN notified). Has no PT follow-up needs. Discussed potential for outpatient ortho PT in the future once pt can WB for LE/ankle stability and higher level sport training.  Ina Homes, PT, DPT Acute Rehabilitation Services  Pager [phone] Office [phone]
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
## 2020-02-25 NOTE — Anesthesia Procedure Notes (Signed)
Procedure Name: Intubation Date/Time: 02/25/2020 12:18 AM Performed by: Molli Hazard, CRNA Pre-anesthesia Checklist: Patient identified, Emergency Drugs available, Suction available and Patient being monitored Patient Re-evaluated:Patient Re-evaluated prior to induction Oxygen Delivery Method: Circle system utilized Preoxygenation: Pre-oxygenation with 100% oxygen Induction Type: IV induction Ventilation: Mask ventilation without difficulty Laryngoscope Size: Miller and 2 Grade View: Grade I Tube type: Oral Tube size: 7.5 mm Number of attempts: 1 Airway Equipment and Method: Stylet Placement Confirmation: ETT inserted through vocal cords under direct vision,  positive ETCO2 and breath sounds checked- equal and bilateral Secured at: 23 cm Tube secured with: Tape Dental Injury: Teeth and Oropharynx as per pre-operative assessment

## 2020-02-25 NOTE — Progress Notes (Deleted)
PT Cancellation Note  Patient Details Name: [name] MRN: [id number] DOB: June 21, 1986   Cancelled Treatment:    Reason Eval/Treat Not Completed: Other (comment). Noted "imminent discharge" orders, but order set states "start tomorrow". Will follow-up for PT Evaluation tomorrow when order released. Please reconsult if this changes and pt needs to be seen later today post-op.  Ina Homes, PT, DPT Acute Rehabilitation Services  Pager [phone] Office [phone]  Malachy Chamber 02/25/2020, 7:54 AM
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
## 2020-02-25 NOTE — Progress Notes (Signed)
[name] to be D/C'd home per MD order.  Discussed prescriptions and follow up appointments with the patient. Prescriptions sent to patient's preferred pharmacy, medication list explained in detail. Pt verbalized understanding.  Allergies as of 02/25/2020   No Known Allergies     Medication List    TAKE these medications   acetaminophen 500 MG tablet Commonly known as: TYLENOL Take 2 tablets (1,000 mg total) by mouth every 8 (eight) hours for 10 days. For Pain.   aspirin EC 81 MG tablet Take 1 tablet (81 mg total) by mouth 2 (two) times daily. For DVT prophylaxis for 30 days after surgery.   cephALEXin 500 MG capsule Commonly known as: Keflex Take 1 capsule (500 mg total) by mouth 3 (three) times daily for 14 days.   gabapentin 300 MG capsule Commonly known as: Neurontin Take 1 capsule (300 mg total) by mouth 3 (three) times daily as needed for up to 14 days (pain). For 2 weeks post op for pain.   multivitamin with minerals Tabs tablet Take 1 tablet by mouth daily.   ondansetron 4 MG tablet Commonly known as: Zofran Take 1 tablet (4 mg total) by mouth every 8 (eight) hours as needed for nausea or vomiting.   oxyCODONE 5 MG immediate release tablet Commonly known as: Roxicodone Take 1 tablet (5 mg total) by mouth every 4 (four) hours as needed for up to 7 days for breakthrough pain.            Durable Medical Equipment  (From admission, onward)         Start     Ordered   02/25/20 1238  For home use only DME Walker rolling  Once    Question Answer Comment  Walker: With 5 Inch Wheels   Patient needs a walker to treat with the following condition Weakness      02/25/20 1237          Vitals:   02/25/20 0824 02/25/20 1407  BP: 134/88 136/79  Pulse: 80 91  Resp: 17 17  Temp: 98.4 F (36.9 C) 97.8 F (36.6 C)  SpO2: 93% 95%     IV catheter discontinued intact. Heart monitor discontinued. Site without signs and symptoms of complications. Dressing and  pressure applied. Pt denies pain at this time. No complaints noted.  An After Visit Summary was printed and given to the patient. Patient escorted via WC, and D/C home via private auto with mother.  Johnette Abraham [facility] 02/25/2020 3:24 PM

## 2020-02-25 NOTE — Transfer of Care (Signed)
Immediate Anesthesia Transfer of Care Note  Patient: KEANON BEVINS  Procedure(s) Performed: ORIF RIGHT LOWER LEG (Right Leg Lower)  Patient Location: PACU  Anesthesia Type:GA combined with regional for post-op pain  Level of Consciousness: awake, alert  and oriented  Airway & Oxygen Therapy: Patient connected to nasal cannula oxygen  Post-op Assessment: Report given to RN and Post -op Vital signs reviewed and stable  Post vital signs: Reviewed and stable  Last Vitals:  Vitals Value Taken Time  BP 151/85 02/25/20 0149  Temp    Pulse 107 02/25/20 0150  Resp 18 02/25/20 0150  SpO2 92 % 02/25/20 0150  Vitals shown include unvalidated device data.  Last Pain:  Vitals:   02/24/20 1935  TempSrc: Tympanic  PainSc:          Complications: No apparent anesthesia complications

## 2020-02-25 NOTE — Progress Notes (Signed)
Orthopedic Tech Progress Note Patient Details:  [name] 1986/10/27 [id number]  Ortho Devices Type of Ortho Device: Velcro wrist splint Ortho Device/Splint Location: RUE Ortho Device/Splint Interventions: Ordered, Application   Post Interventions Patient Tolerated: Well Instructions Provided: Care of device   Donald Pore 02/25/2020, 12:09 PM

## 2020-02-25 NOTE — Anesthesia Procedure Notes (Signed)
Anesthesia Regional Block: Popliteal block   Pre-Anesthetic Checklist: ,, timeout performed, Correct Patient, Correct Site, Correct Laterality, Correct Procedure, Correct Position, site marked, Risks and benefits discussed,  Surgical consent,  Pre-op evaluation,  At surgeon's request and post-op pain management  Laterality: Right  Prep: chloraprep       Needles:  Injection technique: Single-shot  Needle Type: Echogenic Stimulator Needle          Additional Needles:   Procedures:, nerve stimulator,,,,,,,   Nerve Stimulator or Paresthesia:  Response: plantar flexion of foot, 0.45 mA,   Additional Responses:   Narrative:  Start time: 02/25/2020 12:00 AM End time: 02/25/2020 12:04 AM Injection made incrementally with aspirations every 5 mL.  Performed by: Personally  Anesthesiologist: Achille Rich, MD  Additional Notes: Functioning IV was confirmed and monitors were applied.  A 64mm 21ga Arrow echogenic stimulator needle was used. Sterile prep and drape,hand hygiene and sterile gloves were used.  Negative aspiration and negative test dose prior to incremental administration of local anesthetic. The patient tolerated the procedure well.  Ultrasound guidance: relevent anatomy identified, needle position confirmed, local anesthetic spread visualized around nerve(s), vascular puncture avoided.  Image printed for medical record.

## 2020-02-26 NOTE — Anesthesia Postprocedure Evaluation (Signed)
Anesthesia Post Note  Patient: [name]  Procedure(s) Performed: ORIF RIGHT LOWER LEG (Right Leg Lower)     Patient location during evaluation: PACU Anesthesia Type: Regional and General Level of consciousness: awake and alert Pain management: pain level controlled Vital Signs Assessment: post-procedure vital signs reviewed and stable Respiratory status: spontaneous breathing, nonlabored ventilation, respiratory function stable and patient connected to nasal cannula oxygen Cardiovascular status: blood pressure returned to baseline and stable Postop Assessment: no apparent nausea or vomiting Anesthetic complications: no    Last Vitals:  Vitals:   02/25/20 0824 02/25/20 1407  BP: 134/88 136/79  Pulse: 80 91  Resp: 17 17  Temp: 36.9 C 36.6 C  SpO2: 93% 95%    Last Pain:  Vitals:   02/25/20 1500  TempSrc:   PainSc: 3                  Porfirio Bollier S

## 2020-02-26 NOTE — Op Note (Signed)
02/25/2020  7:36 AM  PATIENT:  [name]    PRE-OPERATIVE DIAGNOSIS:  Fractured Right Ankle  POST-OPERATIVE DIAGNOSIS:  Same  PROCEDURE:  ORIF RIGHT LOWER LEG  SURGEON:  Sheral Apley, MD  ASSISTANT: Aquilla Hacker, PA-C, he was present and scrubbed throughout the case, critical for completion in a timely fashion, and for retraction, instrumentation, and closure.   ANESTHESIA:   gen   PREOPERATIVE INDICATIONS:  [name] is a  34 y.o. male with a diagnosis of Fractured Right Ankle who failed conservative measures and elected for surgical management.    The risks benefits and alternatives were discussed with the patient preoperatively including but not limited to the risks of infection, bleeding, nerve injury, cardiopulmonary complications, the need for revision surgery, among others, and the patient was willing to proceed.  OPERATIVE IMPLANTS: stryker ankle stainless hardware, arthrex tight rope  OPERATIVE FINDINGS: Unstable ankle fracture. Stable syndesmosis post op  BLOOD LOSS: min  COMPLICATIONS: none  TOURNIQUET TIME:  OPERATIVE PROCEDURE:  Patient was identified in the preoperative holding area and site was marked by me He was transported to the operating theater and placed on the table in supine position taking care to pad all bony prominences. After a preincinduction time out anesthesia was induced. The right lower extremity was prepped and draped in normal sterile fashion and a pre-incision timeout was performed. [name] received ancef for preoperative antibiotics.    I debrided the open fracture to the level of bone of all devitalized tissue with a ronjur.  I then turned my attention medially where I created a 4 cm incision and dissected sharply down to the medial Mal fracture taking care to protect the saphenous vein. I debrided the fracture and reduced and held in place with a tenaculum. I then drilled and placed 2 partially threaded 45 mm  cannulated screws one anterior and one posterior across the fracture. I performed a complex closure of the medial wound.   I made a lateral incision of roughly 7 cm dissection was carried down sharply to the distal fibula and then spreading dissection was used proximally to protect the superficial peroneal nerve. I sharply incised the periosteum and took care to protect the peroneal tendons. I then debrided the fracture site and performed a reduction maneuver which was held in place with a clamp.   I then selected a 7-hole one third tubular plate and placed in a neutralization fashion care was taken distally so as not to penetrate the joint with the cancellus screws.  I then stressed the syndesmosis and it was unstable for syndesmotic fixation I performed a reduction maneuver with a clamp and placed a tightrope  I assesed the posterior mal piece and it was small enough to not require fixation as it involved less than 20% of the articular surface  The wound was then thoroughly irrigated and closed using a 0 Vicryl and absorbable Monocryl sutures. He was placed in a short leg splint.   POST OPERATIVE PLAN: Non-weightbearing. DVT prophylaxis will consist of mobilization and chemical px

## 2020-02-29 ENCOUNTER — Encounter: Payer: Self-pay | Admitting: *Deleted

## 2020-08-18 ENCOUNTER — Encounter (HOSPITAL_COMMUNITY): Payer: Self-pay | Admitting: Emergency Medicine
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
## 2024-08-23 ENCOUNTER — Ambulatory Visit (HOSPITAL_COMMUNITY): Admission: EM | Admit: 2024-08-23 | Discharge: 2024-08-23 | Disposition: A | Discharge status: Home / Self Care

## 2024-08-23 NOTE — ED Notes (Signed)
 Pt left Ama per lobby staff

## 2024-08-28 ENCOUNTER — Ambulatory Visit (INDEPENDENT_AMBULATORY_CARE_PROVIDER_SITE_OTHER): Admitting: Licensed Clinical Social Worker

## 2024-08-28 ENCOUNTER — Telehealth (HOSPITAL_COMMUNITY): Payer: Self-pay | Admitting: Licensed Clinical Social Worker

## 2024-08-28 ENCOUNTER — Telehealth (HOSPITAL_COMMUNITY): Payer: Self-pay

## 2024-08-28 DIAGNOSIS — F1220 Cannabis dependence, uncomplicated: Secondary | ICD-10-CM | POA: Insufficient documentation

## 2024-08-28 DIAGNOSIS — F1210 Cannabis abuse, uncomplicated: Secondary | ICD-10-CM | POA: Insufficient documentation

## 2024-08-28 DIAGNOSIS — F1010 Alcohol abuse, uncomplicated: Secondary | ICD-10-CM | POA: Diagnosis not present

## 2024-08-28 NOTE — Telephone Encounter (Signed)
 [name] refers this pt to this therapist for substance use counseling. This therapist calls and [name] answers. Therapist confirms his identifying by obtaining two verifiers.  Therapist asks if [name] can come on 09-04-24 at 1pm and he says he is able to.  Therapist asks front desk to schedule him.  Darice Simpler, MS, LMFT, LCAS

## 2024-08-28 NOTE — Telephone Encounter (Signed)
 LCSW  spoke with pt that a form needed to be filled out called an ROI for probation officer. Pt verbalized understanding and stated he did want his probation officer listed as an emergency contact in the mean time provided probation officers name F.J Carney [phone]. Pt reports probation officer is with federal probatio office not state.  LCSW updated emergency contact. LCSW advised pt to wait for phone follow up for SA-IOP.
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
## 2024-09-04 ENCOUNTER — Ambulatory Visit (HOSPITAL_COMMUNITY)

## 2024-09-25 ENCOUNTER — Ambulatory Visit (HOSPITAL_COMMUNITY)

## 2024-10-25 ENCOUNTER — Ambulatory Visit (HOSPITAL_COMMUNITY)

## 2024-11-14 ENCOUNTER — Other Ambulatory Visit (HOSPITAL_COMMUNITY)
Admission: EM | Admit: 2024-11-14 | Discharge: 2024-11-16 | Disposition: A | Discharge status: Home / Self Care | Source: Intra-hospital | Attending: Psychiatry | Admitting: Psychiatry

## 2024-11-14 ENCOUNTER — Other Ambulatory Visit (HOSPITAL_COMMUNITY)
Admission: EM | Admit: 2024-11-14 | Discharge: 2024-11-14 | Disposition: A | Discharge status: Other Acute Inpatient Hospital | Source: Home / Self Care | Attending: Psychiatry | Admitting: Psychiatry

## 2024-11-14 DIAGNOSIS — F1090 Alcohol use, unspecified, uncomplicated: Secondary | ICD-10-CM | POA: Insufficient documentation

## 2024-11-14 DIAGNOSIS — F1010 Alcohol abuse, uncomplicated: Secondary | ICD-10-CM | POA: Insufficient documentation

## 2024-11-14 DIAGNOSIS — G4700 Insomnia, unspecified: Secondary | ICD-10-CM | POA: Insufficient documentation

## 2024-11-14 DIAGNOSIS — I517 Cardiomegaly: Secondary | ICD-10-CM

## 2024-11-14 DIAGNOSIS — F17210 Nicotine dependence, cigarettes, uncomplicated: Secondary | ICD-10-CM | POA: Insufficient documentation

## 2024-11-14 DIAGNOSIS — E876 Hypokalemia: Secondary | ICD-10-CM

## 2024-11-14 DIAGNOSIS — I10 Essential (primary) hypertension: Secondary | ICD-10-CM

## 2024-11-14 DIAGNOSIS — Z720 Tobacco use: Secondary | ICD-10-CM | POA: Diagnosis not present

## 2024-11-14 DIAGNOSIS — F1220 Cannabis dependence, uncomplicated: Secondary | ICD-10-CM | POA: Insufficient documentation

## 2024-11-14 DIAGNOSIS — Z79899 Other long term (current) drug therapy: Secondary | ICD-10-CM | POA: Insufficient documentation

## 2024-11-14 DIAGNOSIS — F341 Dysthymic disorder: Secondary | ICD-10-CM | POA: Insufficient documentation

## 2024-11-14 DIAGNOSIS — F419 Anxiety disorder, unspecified: Secondary | ICD-10-CM | POA: Insufficient documentation

## 2024-11-14 DIAGNOSIS — Z653 Problems related to other legal circumstances: Secondary | ICD-10-CM | POA: Insufficient documentation

## 2024-11-14 DIAGNOSIS — F5104 Psychophysiologic insomnia: Secondary | ICD-10-CM

## 2024-11-14 DIAGNOSIS — G8929 Other chronic pain: Secondary | ICD-10-CM | POA: Insufficient documentation

## 2024-11-14 DIAGNOSIS — Y900 Blood alcohol level of less than 20 mg/100 ml: Secondary | ICD-10-CM | POA: Insufficient documentation

## 2024-11-14 LAB — URINALYSIS, ROUTINE W REFLEX MICROSCOPIC
Bacteria, UA: NONE SEEN
Bilirubin Urine: NEGATIVE
Glucose, UA: NEGATIVE mg/dL
Hgb urine dipstick: NEGATIVE
Ketones, ur: NEGATIVE mg/dL
Nitrite: NEGATIVE
Protein, ur: NEGATIVE mg/dL
Specific Gravity, Urine: 1.009 (ref 1.005–1.030)
pH: 5 (ref 5.0–8.0)

## 2024-11-14 LAB — MAGNESIUM: Magnesium: 2.1 mg/dL (ref 1.7–2.4)

## 2024-11-14 LAB — POCT URINE DRUG SCREEN - MANUAL ENTRY (I-SCREEN)
POC Amphetamine UR: NOT DETECTED
POC Buprenorphine (BUP): NOT DETECTED
POC Cocaine UR: NOT DETECTED
POC Marijuana UR: POSITIVE — AB
POC Methadone UR: NOT DETECTED
POC Methamphetamine UR: NOT DETECTED
POC Morphine: NOT DETECTED
POC Oxazepam (BZO): NOT DETECTED
POC Oxycodone UR: NOT DETECTED
POC Secobarbital (BAR): NOT DETECTED

## 2024-11-14 LAB — COMPREHENSIVE METABOLIC PANEL WITH GFR
ALT: 51 U/L — ABNORMAL HIGH (ref 0–44)
AST: 54 U/L — ABNORMAL HIGH (ref 15–41)
Albumin: 4.4 g/dL (ref 3.5–5.0)
Alkaline Phosphatase: 118 U/L (ref 38–126)
Anion gap: 17 — ABNORMAL HIGH (ref 5–15)
BUN: 7 mg/dL (ref 6–20)
CO2: 20 mmol/L — ABNORMAL LOW (ref 22–32)
Calcium: 9.5 mg/dL (ref 8.9–10.3)
Chloride: 95 mmol/L — ABNORMAL LOW (ref 98–111)
Creatinine, Ser: 0.75 mg/dL (ref 0.61–1.24)
GFR, Estimated: >60 mL/min
Glucose, Bld: 98 mg/dL (ref 70–99)
Potassium: 3.3 mmol/L — ABNORMAL LOW (ref 3.5–5.1)
Sodium: 132 mmol/L — ABNORMAL LOW (ref 135–145)
Total Bilirubin: 0.4 mg/dL (ref 0.0–1.2)
Total Protein: 7.3 g/dL (ref 6.5–8.1)

## 2024-11-14 LAB — CBC WITH DIFFERENTIAL/PLATELET
Abs Immature Granulocytes: 0.02 K/uL (ref 0.00–0.07)
Basophils Absolute: 0.1 K/uL (ref 0.0–0.1)
Basophils Relative: 1 %
Eosinophils Absolute: 0.1 K/uL (ref 0.0–0.5)
Eosinophils Relative: 1 %
HCT: 44.7 % (ref 39.0–52.0)
Hemoglobin: 15.7 g/dL (ref 13.0–17.0)
Immature Granulocytes: 0 %
Lymphocytes Relative: 24 %
Lymphs Abs: 2 K/uL (ref 0.7–4.0)
MCH: 29.8 pg (ref 26.0–34.0)
MCHC: 35.1 g/dL (ref 30.0–36.0)
MCV: 84.8 fL (ref 80.0–100.0)
Monocytes Absolute: 0.8 K/uL (ref 0.1–1.0)
Monocytes Relative: 9 %
Neutro Abs: 5.4 K/uL (ref 1.7–7.7)
Neutrophils Relative %: 65 %
Platelets: 301 K/uL (ref 150–400)
RBC: 5.27 MIL/uL (ref 4.22–5.81)
RDW: 13.4 % (ref 11.5–15.5)
WBC: 8.4 K/uL (ref 4.0–10.5)
nRBC: 0 % (ref 0.0–0.2)

## 2024-11-14 LAB — LIPID PANEL
Cholesterol: 193 mg/dL (ref 0–200)
HDL: 94 mg/dL
LDL Cholesterol: 73 mg/dL (ref 0–99)
Total CHOL/HDL Ratio: 2 ratio
Triglycerides: 129 mg/dL
VLDL: 26 mg/dL (ref 0–40)

## 2024-11-14 LAB — HEPATITIS PANEL, ACUTE
HCV Ab: NONREACTIVE
Hep A IgM: NONREACTIVE
Hep B C IgM: NONREACTIVE
Hepatitis B Surface Ag: NONREACTIVE

## 2024-11-14 LAB — HEMOGLOBIN A1C
Hgb A1c MFr Bld: 5.6 % (ref 4.8–5.6)
Mean Plasma Glucose: 114.02 mg/dL

## 2024-11-14 LAB — ETHANOL: Alcohol, Ethyl (B): 17 mg/dL — ABNORMAL HIGH

## 2024-11-14 LAB — TSH: TSH: 1.83 u[IU]/mL (ref 0.350–4.500)

## 2024-11-14 MED ORDER — MAGNESIUM HYDROXIDE 400 MG/5ML PO SUSP: 30.0000 mL | Freq: Every day | ORAL | Status: DC | PRN

## 2024-11-14 MED ORDER — HYDROXYZINE HCL 25 MG PO TABS
50.0000 mg | ORAL_TABLET | Freq: Three times a day (TID) | ORAL | Status: DC | PRN
  Administered 2024-11-14: 50 mg via ORAL
  Filled 2024-11-14 (×2): qty 2

## 2024-11-14 MED ORDER — ALUM & MAG HYDROXIDE-SIMETH 200-200-20 MG/5ML PO SUSP: 30.0000 mL | ORAL | Status: DC | PRN

## 2024-11-14 MED ORDER — DIPHENHYDRAMINE HCL 50 MG/ML IJ SOLN: 50.0000 mg | Freq: Three times a day (TID) | INTRAMUSCULAR | Status: DC | PRN

## 2024-11-14 MED ORDER — HALOPERIDOL 5 MG PO TABS: 5.0000 mg | ORAL_TABLET | Freq: Three times a day (TID) | ORAL | Status: DC | PRN

## 2024-11-14 MED ORDER — DIPHENHYDRAMINE HCL 50 MG PO CAPS: 50.0000 mg | ORAL_CAPSULE | Freq: Three times a day (TID) | ORAL | Status: DC | PRN

## 2024-11-14 MED ORDER — HALOPERIDOL LACTATE 5 MG/ML IJ SOLN: 5.0000 mg | Freq: Three times a day (TID) | INTRAMUSCULAR | Status: DC | PRN

## 2024-11-14 MED ORDER — ACETAMINOPHEN 325 MG PO TABS: 650.0000 mg | ORAL_TABLET | Freq: Four times a day (QID) | ORAL | Status: DC | PRN

## 2024-11-14 MED ORDER — POTASSIUM CHLORIDE CRYS ER 20 MEQ PO TBCR
40.0000 meq | EXTENDED_RELEASE_TABLET | Freq: Once | ORAL | Status: AC
  Administered 2024-11-14: 40 meq via ORAL
  Filled 2024-11-14: qty 2

## 2024-11-14 MED ORDER — LORAZEPAM 2 MG/ML IJ SOLN: 2.0000 mg | Freq: Three times a day (TID) | INTRAMUSCULAR | Status: DC | PRN

## 2024-11-14 MED ORDER — TRAZODONE HCL 50 MG PO TABS
50.0000 mg | ORAL_TABLET | Freq: Every evening | ORAL | Status: DC | PRN
  Administered 2024-11-14: 50 mg via ORAL
  Filled 2024-11-14: qty 1

## 2024-11-14 MED ORDER — TRAZODONE HCL 50 MG PO TABS: 50.0000 mg | ORAL_TABLET | Freq: Every evening | ORAL | Status: DC | PRN

## 2024-11-14 MED ORDER — HYDROXYZINE HCL 25 MG PO TABS: 50.0000 mg | ORAL_TABLET | Freq: Three times a day (TID) | ORAL | Status: DC | PRN

## 2024-11-14 NOTE — Discharge Instructions (Addendum)
Admit to [facility]

## 2024-11-14 NOTE — ED Provider Notes (Signed)
 Behavioral Health Urgent Care Medical Screening Exam  Patient Name: [name] MRN: [id number] Date of Evaluation: 11/14/2024 Chief Complaint:   Diagnosis:  Final diagnoses:  Alcohol use  Cannabis use disorder, moderate, in controlled environment (HCC)  Tobacco consumption    History of Present illness: [name] is a 38 y.o. male. presents to [facility] unaccompanied. PT states he is here to detox from alcohol and marijuana. PT states his probation officer sent him here to get detoxed from alcohol, marijuana and cocaine. PT denies use of cocaine, but states it is in his system because he touched it. PT denies SI, HI, AVH. Last use of alcohol and drug use was 1.5hrs ago, per PT. Patient requests alcohol and cannabis detox in preparation for [facility] placement which he believes has been secured by his probation officer.   Flowsheet Row ED from 11/14/2024 in [facility] Counselor from 08/28/2024 in [facility]  C-SSRS RISK CATEGORY No Risk No Risk    Psychiatric Specialty Exam  Presentation  General Appearance:Appropriate for Environment  Eye Contact:Good  Speech:Clear and Coherent  Speech Volume:Normal  Handedness:Right   Mood and Affect  Mood: Euthymic  Affect: Congruent   Thought Process  Thought Processes: Coherent; Goal Directed  Descriptions of Associations:Intact  Orientation:Full (Time, Place and Person)  Thought Content:Logical  Diagnosis of Schizophrenia or Schizoaffective disorder in past: No   Hallucinations:None  Ideas of Reference:None  Suicidal Thoughts:No  Homicidal Thoughts:No   Sensorium  Memory: Immediate Good; Recent Fair  Judgment: Fair  Insight: Fair   Art Therapist  Concentration: Fair  Attention Span: Fair  Recall: [employer] of Knowledge: Fair  Language: Fair   Psychomotor Activity  Psychomotor Activity: Normal   Assets   Assets: Manufacturing Systems Engineer; Desire for Improvement; Housing; Physical Health; Resilience; Social Support; Vocational/Educational   Sleep  Sleep: Good  Number of hours: No data recorded  Physical Exam: Physical Exam ROS Blood pressure (!) 140/87, pulse (!) 102, temperature 97.9 F (36.6 C), temperature source Oral, resp. rate 17, SpO2 95%. There is no height or weight on file to calculate BMI.  Musculoskeletal: Strength & Muscle Tone: within normal limits Gait & Station: normal Patient leans: N/A   [facility] MSE Discharge Disposition for Follow up and Recommendations: Admit to [facility] for multimodal treatment of alcohol and cannabis use.   Lucas JAYSON HAHN, MD 11/14/2024, 5:29 PM

## 2024-11-14 NOTE — Progress Notes (Signed)
" °   11/14/24 1617  [facility] Triage Screening (Walk-ins at [facility] only)  What Is the Reason for Your Visit/Call Today? [name] [phone] male presents to [facility] unaccompanied. PT states he is here to detox from alcohol and marijuana. PT states his probation officer sent him here to get detoxed from alcohol, marijuana and cocaine. PT denies use of cocaine, but states it is in his system because he touched it. PT denies SI, HI, AVH. Last use of alcohol and drug use was 1.5hrs ago, per PT.  How Long Has This Been Causing You Problems? > than 6 months  Have You Recently Had Any Thoughts About Hurting Yourself? No  Are You Planning to Commit Suicide/Harm Yourself At This time? No  Have you Recently Had Thoughts About Hurting Someone Sherral? No  Are You Planning To Harm Someone At This Time? No  Physical Abuse Denies  Verbal Abuse Denies  Sexual Abuse Denies  Exploitation of patient/patient's resources Denies  Self-Neglect Yes, present (Comment)  Are you currently experiencing any auditory, visual or other hallucinations? No  Have You Used Any Alcohol or Drugs in the Past 24 Hours? Yes  What Did You Use and How Much? alcohol (2 mixed drinks, bought from store) and marijuana (2 blunts)  Do you have any current medical co-morbidities that require immediate attention? Yes (high blood pressure)  Clinician description of patient physical appearance/behavior: calm, cooperative, oriented, dressed casually  What Do You Feel Would Help You the Most Today? Alcohol or Drug Use Treatment  Determination of Need Routine (7 days)  Options For Referral Facility-Based Crisis    "
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
## 2024-11-15 MED ORDER — TRAZODONE HCL 50 MG PO TABS
50.0000 mg | ORAL_TABLET | Freq: Every day | ORAL | Status: DC
  Administered 2024-11-15: 50 mg via ORAL
  Filled 2024-11-15: qty 1

## 2024-11-15 MED ORDER — HYDROXYZINE HCL 25 MG PO TABS
25.0000 mg | ORAL_TABLET | Freq: Four times a day (QID) | ORAL | Status: DC | PRN
  Administered 2024-11-15: 25 mg via ORAL

## 2024-11-15 MED ORDER — ADULT MULTIVITAMIN W/MINERALS CH
1.0000 | ORAL_TABLET | Freq: Every day | ORAL | Status: DC
  Administered 2024-11-15 – 2024-11-16 (×2): 1 via ORAL
  Filled 2024-11-15 (×2): qty 1

## 2024-11-15 MED ORDER — CHLORDIAZEPOXIDE HCL 25 MG PO CAPS: 25.0000 mg | ORAL_CAPSULE | Freq: Four times a day (QID) | ORAL | Status: DC | PRN

## 2024-11-15 MED ORDER — POTASSIUM CHLORIDE CRYS ER 20 MEQ PO TBCR: 40.0000 meq | EXTENDED_RELEASE_TABLET | Freq: Once | ORAL | Status: DC

## 2024-11-15 MED ORDER — ONDANSETRON 4 MG PO TBDP: 4.0000 mg | ORAL_TABLET | Freq: Four times a day (QID) | ORAL | Status: DC | PRN

## 2024-11-15 MED ORDER — CLONIDINE HCL 0.1 MG PO TABS
0.1000 mg | ORAL_TABLET | Freq: Once | ORAL | Status: AC
  Administered 2024-11-15: 0.1 mg via ORAL
  Filled 2024-11-15: qty 1

## 2024-11-15 MED ORDER — LOPERAMIDE HCL 2 MG PO CAPS: 2.0000 mg | ORAL_CAPSULE | ORAL | Status: DC | PRN

## 2024-11-15 MED ORDER — DULOXETINE HCL 30 MG PO CPEP
30.0000 mg | ORAL_CAPSULE | Freq: Once | ORAL | Status: AC
  Administered 2024-11-15: 30 mg via ORAL
  Filled 2024-11-15: qty 1

## 2024-11-15 MED ORDER — THIAMINE HCL 100 MG/ML IJ SOLN
100.0000 mg | Freq: Once | INTRAMUSCULAR | Status: AC
  Administered 2024-11-15: 100 mg via INTRAMUSCULAR
  Filled 2024-11-15: qty 2

## 2024-11-15 MED ORDER — DULOXETINE HCL 60 MG PO CPEP
60.0000 mg | ORAL_CAPSULE | Freq: Every day | ORAL | Status: DC
  Administered 2024-11-16: 60 mg via ORAL
  Filled 2024-11-15: qty 1

## 2024-11-15 NOTE — Group Note (Signed)
 Group Topic: Recovery Basics  Group Date: 11/15/2024 Start Time: 1000 End Time: 1050 Facilitators: Gerome Jolly, NT  Department: [facility]  Number of Participants: 5  Group Focus: substance abuse education Treatment Modality:  Cognitive Behavioral Therapy, Psychoeducation, Solution-Focused Therapy, and Spiritual Interventions utilized were exploration and story telling Purpose: explore maladaptive thinking, increase insight, and  Facilitator shared his recovery journey, identifying the pitfalls along the way. This was done to illustrate that recovery is possible. The facilitator engaged the group in writing a gratitude list and sharing that list with group if they felt comfortable  Name: [name] Date of Birth: 02/27/1986  MR: [id number]    Level of Participation: pt did not attend group Quality of Participation: na Interactions with others: na Mood/Affect: na Triggers (if applicable): NA Cognition: na Progress: na Response: Pt did into attend group Plan: follow-up needed  Patients Problems:  Patient Active Problem List   Diagnosis Date Noted   Alcohol use 11/14/2024   Tobacco consumption 11/14/2024   Cannabis use disorder, moderate, dependence (HCC) 11/14/2024   Cannabis use disorder, moderate, in controlled environment (HCC) 08/28/2024   Alcohol abuse 08/28/2024   Open right ankle fracture 02/25/2020

## 2024-11-15 NOTE — ED Notes (Signed)
 Pt B/ P manually is 136/76 manually, earlier B/P162/80 manually, NP made aware, Pt given Clonidine 0.1 mg po per NP recommendations, Pt stated, he was feeling better, after receiving the clonidine.  Pt is pleasant but talks loud, able to clearly make needs known.  Denies SI, or AVH. Safety maintained/

## 2024-11-15 NOTE — Group Note (Signed)
 Group Topic: Social Support  Group Date: 11/15/2024 Start Time: 1130 End Time: 1200 Facilitators: Leron Charolette CROME, RN  Department: [facility]  Number of Participants: 5  Group Focus: goals/reality orientation and self-awareness Treatment Modality:  Individual Therapy Interventions utilized were patient education and support Purpose: increase insight  Name: [name] Date of Birth: 10-Feb-1986  MR: [id number]    Level of Participation: moderate Quality of Participation: cooperative Interactions with others: gave feedback Mood/Affect: appropriate Triggers (if applicable): na Cognition: logical Progress: Moderate Response: progressive Plan: patient will be encouraged to continue to use resources   Patients Problems:  Patient Active Problem List   Diagnosis Date Noted   Alcohol use 11/14/2024   Tobacco consumption 11/14/2024   Cannabis use disorder, moderate, dependence (HCC) 11/14/2024   Cannabis use disorder, moderate, in controlled environment (HCC) 08/28/2024   Alcohol abuse 08/28/2024   Open right ankle fracture 02/25/2020

## 2024-11-15 NOTE — ED Notes (Signed)
 Patient denies pain and is resting comfortably.

## 2024-11-15 NOTE — Group Note (Signed)
 Group Topic: Social Support  Group Date: 11/15/2024 Start Time: 0800 End Time: 0845 Facilitators: Nena Lesley PARAS, NT  Department: [facility]  Number of Participants: 5  Group Focus: check in and community group Treatment Modality:  Psychoeducation Interventions utilized were exploration and support Purpose: explore maladaptive thinking, express feelings, and increase insight  Name: [name] Date of Birth: [birth year]  MR: [id number]    Level of Participation: active Quality of Participation: engaged Interactions with others: gave feedback Mood/Affect: appropriate Triggers (if applicable): N/A Cognition: coherent/clear Progress: Gaining insight Response: The patient did not have any concerns. His behavior and communication was appropriate and pleasant throughout the group. Plan: patient will be encouraged to continue coming to groups throughout the duration of his treatment and inform staff if any concerns arise   Patients Problems:  Patient Active Problem List   Diagnosis Date Noted   Alcohol use 11/14/2024   Tobacco consumption 11/14/2024   Cannabis use disorder, moderate, dependence (HCC) 11/14/2024   Cannabis use disorder, moderate, in controlled environment (HCC) 08/28/2024   Alcohol abuse 08/28/2024   Open right ankle fracture 02/25/2020

## 2024-11-15 NOTE — ED Notes (Signed)
"  Patient asleep at this time   "

## 2024-11-15 NOTE — ED Notes (Signed)
 Pt received at the beginning of the shift , in bed room calm without complaints.  Safety maintained.
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
## 2024-11-15 NOTE — ED Notes (Signed)
 Patient A&Ox4. Denies intent to harm self/others when asked. Denies A/VH. Patient denies any physical complaints when asked. No acute distress noted. Support and encouragement provided. Routine safety checks conducted according to facility protocol. Encouraged patient to notify staff if thoughts of harm toward self or others arise. Patient verbalize understanding and agreement. Will continue to monitor for safety.

## 2024-11-15 NOTE — Care Management (Addendum)
 [facility] Care Management...   Writer will follow up with [facility] and officer F.J. tomorrow after speaking with Olivia.  Writer will provide patient out patient Mining Engineer spoke with F.J. Carney patients Research Scientist (life Sciences)  Per F.J. he has spoken to [place] at [facility] and they have a bed secured for patient . F.J. reported that he would prefer that patient does 30 days inpatient treatment. However, he is open to patient completing an out patient program (ie [facility])  F.J stated that if writer has issue getting patient into [facility], to give him a call and he will contact Olivia or Glade at  [employer] left HIPAA compliant message for michele to return call

## 2024-11-15 NOTE — Care Management (Signed)
 [facility] Care Management...  Writer met with the client to discuss discharge planning.  Client reports he's currently on [facility]  Writer informed the client the average stay at [facility] is 3-5 business days.  Writer inquired with the client about residential treatment.  Client declined due to having to work to support his wife and newborn child.    Client reports he's interested in [facility] through [facility].  Writer informed the Client [facility] is no longer accepting new clients due to them relocating to a new facility.  Per Rosaline, at [facility], she is unsure how long the transfer will take.  Client reports he's currently employed at the [employer].    Client's probation officer is F.J. Carney: [phone].

## 2024-11-15 NOTE — Care Plan (Signed)
 Interdisciplinary Treatment and Diagnostic Plan Update  11/15/2024 Time of Session: 2pm DENARIO BAGOT MRN: [id number]  Diagnosis:  Final diagnoses:  Cannabis use disorder, moderate, dependence (HCC)  Alcohol use     Current Medications:  Current Facility-Administered Medications  Medication Dose Route Frequency Provider Last Rate Last Admin   acetaminophen  (TYLENOL ) tablet 650 mg  650 mg Oral Q6H PRN Bethea, Terrence C, MD       alum & mag hydroxide-simeth (MAALOX/MYLANTA) 200-200-20 MG/5ML suspension 30 mL  30 mL Oral Q4H PRN Bethea, Terrence C, MD       haloperidol (HALDOL) tablet 5 mg  5 mg Oral TID PRN Cole Kandi BROCKS, MD       And   diphenhydrAMINE  (BENADRYL ) capsule 50 mg  50 mg Oral TID PRN Bethea, Terrence C, MD       haloperidol lactate (HALDOL) injection 5 mg  5 mg Intramuscular TID PRN Cole Kandi BROCKS, MD       And   diphenhydrAMINE  (BENADRYL ) injection 50 mg  50 mg Intramuscular TID PRN Cole Kandi BROCKS, MD       And   LORazepam (ATIVAN) injection 2 mg  2 mg Intramuscular TID PRN Bethea, Terrence C, MD       hydrOXYzine (ATARAX) tablet 50 mg  50 mg Oral TID PRN Bethea, Terrence C, MD   50 mg at 11/14/24 2134   magnesium hydroxide (MILK OF MAGNESIA) suspension 30 mL  30 mL Oral Daily PRN Bethea, Terrence C, MD       traZODone (DESYREL) tablet 50 mg  50 mg Oral QHS PRN Bethea, Terrence C, MD   50 mg at 11/14/24 2134   No current outpatient medications on file.   PTA Medications: Prior to Admission medications  Not on File    Patient Stressors: Legal issue   Substance abuse    Patient Strengths: Ability for insight  Motivation for treatment/growth   Treatment Modalities: Medication Management, Group therapy, Case management,  1 to 1 session with clinician, Psychoeducation, Recreational therapy.   Physician Treatment Plan for Primary and Secondary Diagnosis:  Final diagnoses:  Cannabis use disorder, moderate, dependence (HCC)  Alcohol use   Long Term  Goal(s):    Short Term Goals:    Medication Management: Evaluate patient's response, side effects, and tolerance of medication regimen.  Therapeutic Interventions: 1 to 1 sessions, Unit Group sessions and Medication administration.  Evaluation of Outcomes: Progressing  LCSW Treatment Plan for Primary Diagnosis:  Final diagnoses:  Cannabis use disorder, moderate, dependence (HCC)  Alcohol use    Long Term Goal(s): Safe transition to appropriate next level of care at discharge.  Short Term Goals: Facilitate acceptance of mental health diagnosis and concerns through verbal commitment to aftercare plan and appointments at discharge.  Therapeutic Interventions: Assess for all discharge needs, 1 to 1 time with Child psychotherapist, Explore available resources and support systems, Assess for adequacy in community support network, Educate family and significant other(s) on suicide prevention, Complete Psychosocial Assessment, Interpersonal group therapy.  Evaluation of Outcomes: Progressing   Progress in Treatment: Attending groups: Yes. Participating in groups: Yes. Taking medication as prescribed: Yes. Toleration medication: No. Family/Significant other contact made: Yes, individual(s) contacted:  wife Patient understands diagnosis: Yes. Discussing patient identified problems/goals with staff: Yes. Medical problems stabilized or resolved: Yes. Denies suicidal/homicidal ideation: Yes. Issues/concerns per patient self-inventory: No. Other: Client is on federal probation.  Client tested positive for THC and cocaine.  Client's P.O. reports a bed is secured for  him at [facility] once he completes detox at [facility].  Writer has called [facility] and asked them to confirm.   New problem(s) identified: No, Describe:     New Short Term/Long Term Goal(s): Client reports his goal to complete detox and enter into treatment at [facility] or [facility].  Client reports he needs sobriety so he will not test positive for  substance through his P.O.   Patient Goals:  Client goal is test negative from substances and to be coordinated with substance abuse resources([facility]) within a week of leaving [facility].     Discharge Plan or Barriers: Client reports barriers to inpatient / residential treatment is losing his job at the [employer].    Reason for Continuation of Hospitalization: Anxiety Other; describe coordinate services for residential/ outpatient resources.  Estimated Length of Stay:11/14/24 -11/16/24  Last 3 [place] Suicide Severity Risk Score: Flowsheet Row ED from 11/14/2024 in [facility] Most recent reading at 11/14/2024  9:00 PM ED from 11/14/2024 in [facility] Most recent reading at 11/14/2024  4:23 PM Counselor from 08/28/2024 in [facility] Most recent reading at 08/28/2024  8:31 AM  C-SSRS RISK CATEGORY No Risk No Risk No Risk    Last PHQ 2/9 Scores:    08/28/2024    8:28 AM  Depression screen PHQ 2/9  Decreased Interest 0  Down, Depressed, Hopeless 0  PHQ - 2 Score 0    Scribe for Treatment Team: Emilie Carp, LCSW 11/15/2024 2:50 PM

## 2024-11-15 NOTE — Discharge Instructions (Addendum)
 [facility] Care Management...   Patient requested discharge today Friday 11/16/2024  Patient will discharge to home... [address] [place] [address]   RN to arrange transportation  30 day Scripts    Patient will follow up with [facility] for 30 day residential. Referral has been faxed   [facility] [address] [place], [place] [address] Phone: [phone]  Patient will also explore options for [facility]  [facility] [address] [place], [place] [phone] Call to schedule intake/assessment appointment   [facility] [address], [place], [place], [address] [phone] phone [phone] fax NOTE: Does have Substance Abuse-Intensive Outpatient Program [facility]) as well as transitional housing if eligible.  [facility] [address] [place], [place], [address] [phone] phone [phone] fax  Diet: Portion-limited diet rich in produce, whole (minimally processed) grains, nuts/seeds, eggs, beans/legumes, seafood, lowfat dairy (if tolerated), fermented food, lean meat. Copious water intake. Limit (ultra)processed foods and sugar-sweetened beverages.    Other: -Follow-up with your outpatient psychiatric provider -instructions on appointment date, time, and address (location) are provided to you in discharge paperwork. [facility].   -Take your psychiatric medications as prescribed at discharge - instructions are provided to you in the discharge paperwork. Continue duloxetine 60mg  qAM, trazodone 50mg  at bedtime, hydroxyzine PRN.   -Follow-up with outpatient primary care doctor to optimize health maintenance/preventative care and other specialists -for management of chronic medical disease. See PCP for management of hypertension and continue amlodipine 10mg  at bedtime.    -Recommend total abstinence from alcohol, tobacco, and other illicit drug use at discharge.    -If your psychiatric  symptoms recur, worsen, or if you have side effects to your psychiatric medications, call your outpatient psychiatric provider, [phone] or go to the nearest emergency department.   -If suicidal thoughts occur, immediately call your outpatient psychiatric provider, 911, [phone] or go to the nearest emergency department.

## 2024-11-15 NOTE — Group Note (Addendum)
 Group Topic: Recovery Basics  Group Date: 11/14/2024 Start Time: 2005 End Time: 2105 Facilitators: Nathanael Moulder, NT  Department: [facility]  Number of Participants: 9  Group Focus: abuse issues, check in, chemical dependency education, chemical dependency issues, co-dependency, community group, relapse prevention, and substance abuse education Treatment Modality:  Psychoeducation Interventions utilized were reminiscence, story telling, and support Purpose: enhance coping skills and relapse prevention strategies  Name: [name] Date of Birth: October 09, 1986  MR: [id number]    Level of Participation: active (joined after transferred to unit and group had started) Quality of Participation: attentive, cooperative, engaged, and supportive Interactions with others: Appropriate Mood/Affect: appropriate Triggers (if applicable): None Cognition: coherent/clear Progress: Gaining insight Response: Appropriate Plan: patient will be encouraged to attend future groups  Patients Problems:  Patient Active Problem List   Diagnosis Date Noted   Alcohol use 11/14/2024   Tobacco consumption 11/14/2024   Cannabis use disorder, moderate, dependence (HCC) 11/14/2024   Cannabis use disorder, moderate, in controlled environment (HCC) 08/28/2024   Alcohol abuse 08/28/2024   Open right ankle fracture 02/25/2020

## 2024-11-16 DIAGNOSIS — F5104 Psychophysiologic insomnia: Secondary | ICD-10-CM

## 2024-11-16 DIAGNOSIS — E876 Hypokalemia: Secondary | ICD-10-CM

## 2024-11-16 DIAGNOSIS — I10 Essential (primary) hypertension: Secondary | ICD-10-CM

## 2024-11-16 LAB — PROLACTIN: Prolactin: 3.3 ng/mL — ABNORMAL LOW (ref 3.9–22.7)

## 2024-11-16 MED ORDER — AMLODIPINE BESYLATE 10 MG PO TABS
10.0000 mg | ORAL_TABLET | Freq: Every morning | ORAL | Status: DC
  Administered 2024-11-16: 10 mg via ORAL
  Filled 2024-11-16: qty 1

## 2024-11-16 MED ORDER — AMLODIPINE BESYLATE 10 MG PO TABS: 10.0000 mg | ORAL_TABLET | Freq: Every day | ORAL | 0 refills | Status: AC

## 2024-11-16 MED ORDER — HYDROXYZINE HCL 25 MG PO TABS: 25.0000 mg | ORAL_TABLET | Freq: Three times a day (TID) | ORAL | 0 refills | Status: AC | PRN

## 2024-11-16 MED ORDER — TRAZODONE HCL 50 MG PO TABS: 50.0000 mg | ORAL_TABLET | Freq: Every day | ORAL | 0 refills | Status: AC

## 2024-11-16 MED ORDER — DULOXETINE HCL 60 MG PO CPEP: 60.0000 mg | ORAL_CAPSULE | Freq: Every day | ORAL | 0 refills | Status: AC

## 2024-11-16 NOTE — Care Management (Addendum)
 [facility] Care Management...  Addendum 9:27 am  Writer received call from officer Carney.Scientist, Research (medical) advised Officer Carney of patients discharging today and the address that we will be transporting patient to [address]  Writer advised Surveyor, Quantity of conversation with Olivia at [facility] and that clinical research associate faxed referral to [facility].  Writer advised Surveyor, Quantity of Out patient resources for [facility] provided for patient to follow up with to schedule an appointment       Writer spoke with Olivia Hope at [facility]  Per [place] they DO NOT secure beds for patients.  Writer advised Olivia that he will relay information to patient's probation Research Officer, Political Party spoke with patient and advise patient of conversation with Olivia [place] faxed referral to [facility] [employer] will provide patient information for [facility] for out patient BRANDY Berlin will reach out to [employer]. Carney. He was unable to talk, left text message for return call

## 2024-11-16 NOTE — ED Provider Notes (Addendum)
 [facility] ASAP Discharge Summary  Date and Time: 11/16/2024 9:37 AM  Name: [name]  MRN:  [id number]   Discharge Diagnoses:  Final diagnoses:  Cannabis use disorder, moderate, dependence (HCC)  Alcohol use  Hypokalemia hypertension  Subjective: [name] is in a good mood this morning and eager for discharge. He was somewhat disappointed by PO plan for [facility] was incomplete. Given that rehabilitation access will not be available for weeks, he would like to discharge home with IOP option while he resumes work and family life. Patient reports no significant impact from initiation of duloxetine for anxiety, dysthymia, pain. Trazodone did appear to help with sleep. He is concerned about his blood pressure and recalls taking amlodipine in the past. He would like to resume. Patient would like to discharge this morning.  Stay Summary: [name] is a 39 y.o. male presented to [facility] unaccompanied on 11/14/24. PT states he is here to detox from alcohol and marijuana. PT states his probation officer sent him here to get detoxed from alcohol, marijuana and cocaine. PT denies use of cocaine, but states it is in his system because he touched it. PT denies SI, HI, AVH. Last use of alcohol and drug use was 1.5hrs ago, per PT. Patient requests alcohol and cannabis detox in preparation for [facility] placement which he believes has been secured by his probation officer.   Patient admitted directly to [facility] for multimodal treatment for polysubstance use disorder. Patient quickly adjusted to milieu. The patient was evaluated each day by a clinical provider to ascertain response to treatment. Duloxetine initiated at 30mg  and increased to 60mg  to target anxiety, dysthymia, and chronic pain. Trazodone initiated for insomnia. Improvement was noted by the patient's report of decreasing symptoms, improved sleep and appetite, affect, medication tolerance, behavior, and participation in unit programming. Patient's care was  discussed during the interdisciplinary team meeting every day during the hospitalization.  Patient was asked each day to complete a self inventory noting mood, mental status, pain, new symptoms, anxiety and concerns. Labs were reviewed with the patient, and abnormal results were discussed with the patient. Patient was concerned about hypertension and agreed to resume amlodipine on day of discharge.   Symptoms were reported as significantly decreased or resolved completely by discharge.     The patient denied having side effects to prescribed psychiatric or somatic medication.  Referral to [facility] is active but patient cannot be admitted to their program at this time due to capacity limitations.   Total Time spent with patient: I personally spent 30 minutes on the unit in direct patient care. The direct patient care time included face-to-face time with the patient, reviewing the patient's chart, communicating with other professionals, and coordinating care. Greater than 50% of this time was spent in counseling or coordinating care with the patient regarding goals of hospitalization, psycho-education, and discharge planning needs.   On my assessment the patient denied SI, HI, AVH, paranoia, ideas of reference, or first rank symptoms on day of discharge. Patient denied drug cravings or active signs of withdrawal. Patient denied medication side-effects. Patient was not deemed to be a danger to self or others on day of discharge and was in agreement with discharge plans.   Past Psychiatric History: Denies hospitalization, medication trials, SI/NSSI.  R/o alcohol use disorder (binge) Cannabis use disorder   Is the patient at risk to self? No  Has the patient been a risk to self in the past 6 months? No .    Has  the patient been a risk to self within the distant past? No   Is the patient a risk to others? No   Has the patient been a risk to others in the past 6 months? No   Has the patient been a  risk to others within the distant past? No    Past Medical History: NKDA, NKFA, HTN (incarceration) Family History: Denies significant medical/psychiatric history EXCEPT mother - nicotine , maternal GM/GF (alcohol) Social History: Lives with spouse, 3 step children (13, 3, 75), 3 biological children (ages 64, 5, 3 months). 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 Currently on federal probation. Daily alcohol use 3-4 Clubtails every other day. Daily cannabis use 3-4 blunts daily. Denies significant use of other substances. 1PPD smoker. Tobacco Cessation:  A prescription for an FDA-approved tobacco cessation medication was offered at discharge and the patient refused  Current Medications:  Current Facility-Administered Medications  Medication Dose Route Frequency Provider Last Rate Last Admin   acetaminophen  (TYLENOL ) tablet 650 mg  650 mg Oral Q6H PRN Jessi Pitstick C, MD       alum & mag hydroxide-simeth (MAALOX/MYLANTA) 200-200-20 MG/5ML suspension 30 mL  30 mL Oral Q4H PRN Kinza Gouveia C, MD       amLODipine (NORVASC) tablet 10 mg  10 mg Oral q AM Jeanmarie Mccowen C, MD   10 mg at 11/16/24 9073   chlordiazePOXIDE (LIBRIUM) capsule 25 mg  25 mg Oral Q6H PRN Onuoha, Chinwendu V, NP       haloperidol (HALDOL) tablet 5 mg  5 mg Oral TID PRN Cole Kandi BROCKS, MD       And   diphenhydrAMINE  (BENADRYL ) capsule 50 mg  50 mg Oral TID PRN Jamyah Folk C, MD       haloperidol lactate (HALDOL) injection 5 mg  5 mg Intramuscular TID PRN Cole Kandi BROCKS, MD       And   diphenhydrAMINE  (BENADRYL ) injection 50 mg  50 mg Intramuscular TID PRN Cole Kandi BROCKS, MD       And   LORazepam (ATIVAN) injection 2 mg  2 mg Intramuscular TID PRN Roquel Burgin C, MD       DULoxetine (CYMBALTA) DR capsule 60 mg  60 mg Oral Daily Nkwenti, Doris, NP   60 mg at 11/16/24 9073   hydrOXYzine (ATARAX) tablet 25 mg  25 mg Oral Q6H PRN Onuoha, Chinwendu V, NP   25 mg at 11/15/24 2128   loperamide (IMODIUM) capsule  2-4 mg  2-4 mg Oral PRN Onuoha, Chinwendu V, NP       magnesium hydroxide (MILK OF MAGNESIA) suspension 30 mL  30 mL Oral Daily PRN Delecia Vastine C, MD       multivitamin with minerals tablet 1 tablet  1 tablet Oral Daily Onuoha, Chinwendu V, NP   1 tablet at 11/16/24 9073   ondansetron  (ZOFRAN -ODT) disintegrating tablet 4 mg  4 mg Oral Q6H PRN Onuoha, Chinwendu V, NP       traZODone (DESYREL) tablet 50 mg  50 mg Oral QHS Nkwenti, Doris, NP   50 mg at 11/15/24 2128   Current Outpatient Medications  Medication Sig Dispense Refill   amLODipine (NORVASC) 10 MG tablet Take 1 tablet (10 mg total) by mouth at bedtime. 30 tablet 0   hydrOXYzine (ATARAX) 25 MG tablet Take 1 tablet (25 mg total) by mouth 3 (three) times daily as needed for anxiety. 30 tablet 0   DULoxetine (CYMBALTA) 60 MG capsule Take 1 capsule (60 mg total) by  mouth daily. 30 capsule 0   traZODone (DESYREL) 50 MG tablet Take 1 tablet (50 mg total) by mouth at bedtime. 30 tablet 0    PTA Medications:  Facility Ordered Medications  Medication   [COMPLETED] potassium chloride SA (KLOR-CON M) CR tablet 40 mEq   acetaminophen  (TYLENOL ) tablet 650 mg   alum & mag hydroxide-simeth (MAALOX/MYLANTA) 200-200-20 MG/5ML suspension 30 mL   haloperidol (HALDOL) tablet 5 mg   And   diphenhydrAMINE  (BENADRYL ) capsule 50 mg   haloperidol lactate (HALDOL) injection 5 mg   And   diphenhydrAMINE  (BENADRYL ) injection 50 mg   And   LORazepam (ATIVAN) injection 2 mg   magnesium hydroxide (MILK OF MAGNESIA) suspension 30 mL   traZODone (DESYREL) tablet 50 mg   [COMPLETED] DULoxetine (CYMBALTA) DR capsule 30 mg   Followed by   DULoxetine (CYMBALTA) DR capsule 60 mg   [COMPLETED] thiamine (VITAMIN B1) injection 100 mg   multivitamin with minerals tablet 1 tablet   chlordiazePOXIDE (LIBRIUM) capsule 25 mg   hydrOXYzine (ATARAX) tablet 25 mg   loperamide (IMODIUM) capsule 2-4 mg   ondansetron  (ZOFRAN -ODT) disintegrating tablet 4 mg    [COMPLETED] cloNIDine (CATAPRES) tablet 0.1 mg   amLODipine (NORVASC) tablet 10 mg   PTA Medications  Medication Sig   hydrOXYzine (ATARAX) 25 MG tablet Take 1 tablet (25 mg total) by mouth 3 (three) times daily as needed for anxiety.   amLODipine (NORVASC) 10 MG tablet Take 1 tablet (10 mg total) by mouth at bedtime.   DULoxetine (CYMBALTA) 60 MG capsule Take 1 capsule (60 mg total) by mouth daily.   traZODone (DESYREL) 50 MG tablet Take 1 tablet (50 mg total) by mouth at bedtime.       11/15/2024    3:20 PM 08/28/2024    8:28 AM  Depression screen PHQ 2/9  Decreased Interest 1 0  Down, Depressed, Hopeless 1 0  PHQ - 2 Score 2 0  Altered sleeping 1   Tired, decreased energy 1   Change in appetite 1   Feeling bad or failure about yourself  1   Trouble concentrating 1   Moving slowly or fidgety/restless 1   Suicidal thoughts 1   PHQ-9 Score 9   Difficult doing work/chores Very difficult     Flowsheet Row ED from 11/14/2024 in [facility] Most recent reading at 11/15/2024  3:20 PM ED from 11/14/2024 in [facility] Most recent reading at 11/14/2024  4:23 PM Counselor from 08/28/2024 in [facility] Most recent reading at 08/28/2024  8:31 AM  C-SSRS RISK CATEGORY Error: Q3, 4, or 5 should not be populated when Q2 is No No Risk No Risk    Musculoskeletal  Strength & Muscle Tone: within normal limits Gait & Station: normal Patient leans: N/A  Psychiatric Specialty Exam  Presentation  General Appearance:  Appropriate for Environment  Eye Contact: Good  Speech: Clear and Coherent  Speech Volume: Normal  Handedness: Right   Mood and Affect  Mood: Euthymic  Affect: Congruent   Thought Process  Thought Processes: Coherent; Goal Directed  Descriptions of Associations:Intact  Orientation:Full (Time, Place and Person)  Thought Content:Logical  Diagnosis of Schizophrenia or  Schizoaffective disorder in past: No    Hallucinations:Hallucinations: None  Ideas of Reference:None  Suicidal Thoughts:Suicidal Thoughts: No  Homicidal Thoughts:Homicidal Thoughts: No   Sensorium  Memory: Immediate Good; Recent Fair  Judgment: Good  Insight: Good   Executive Functions  Concentration: Fair  Attention Span: Fair  Recall: Metta Abe of Knowledge: Fair  Language: Fair   Psychomotor Activity  Psychomotor Activity: Psychomotor Activity: Normal   Assets  Assets: Manufacturing Systems Engineer; Desire for Improvement; Housing; Resilience   Sleep  Sleep: Sleep: Good  Estimated Sleeping Duration (Last 24 Hours): 13.25-15.75 hours  No data recorded  Physical Exam  Physical Exam ROS Blood pressure 126/85, pulse 73, temperature 97.9 F (36.6 C), temperature source Oral, resp. rate 18, SpO2 97%. There is no height or weight on file to calculate BMI.  Demographic Factors:  Male and Low socioeconomic status  Loss Factors: NA  Historical Factors: Family history of mental illness or substance abuse  Risk Reduction Factors:   Responsible for children under 26 years of age, Sense of responsibility to family, Employed, Living with another person, especially a relative, and Positive social support  Continued Clinical Symptoms:  Severe Anxiety and/or Agitation Alcohol/Substance Abuse/Dependencies Medical Diagnoses and Treatments/Surgeries  Cognitive Features That Contribute To Risk:  None    Suicide Risk:  Minimal: No identifiable suicidal ideation.  Patients presenting with no risk factors but with morbid ruminations; may be classified as minimal risk based on the severity of the depressive symptoms  Plan Of Care/Follow-up recommendations:  Patient will follow up with [facility] for 30 day residential. Referral has been faxed  Patient will also explore options for SAIOP  Diet: Portion-limited diet rich in produce, whole (minimally processed)  grains, nuts/seeds, eggs, beans/legumes, seafood, lowfat dairy (if tolerated), fermented food, lean meat. Copious water intake. Limit (ultra)processed foods and sugar-sweetened beverages.    Other: -Follow-up with your outpatient psychiatric provider -instructions on appointment date, time, and address (location) are provided to you in discharge paperwork. [facility].   -Take your psychiatric medications as prescribed at discharge - instructions are provided to you in the discharge paperwork. Continue duloxetine 60mg  qAM, trazodone 50mg  at bedtime, hydroxyzine PRN.   -Follow-up with outpatient primary care doctor to optimize health maintenance/preventative care and other specialists -for management of chronic medical disease. See PCP for management of hypertension and continue amlodipine 10mg  at bedtime.    -Recommend total abstinence from alcohol, tobacco, and other illicit drug use at discharge.    -If your psychiatric symptoms recur, worsen, or if you have side effects to your psychiatric medications, call your outpatient psychiatric provider, 911, [phone] or go to the nearest emergency department.   -If suicidal thoughts occur, immediately call your outpatient psychiatric provider, 911, 988 or go to the nearest emergency department.  Disposition: Discharge home.  KANDI JAYSON HAHN, MD 11/16/2024, 9:37 AM

## 2024-11-16 NOTE — ED Notes (Signed)

## 2024-11-16 NOTE — ED Notes (Signed)
 Patient A&Ox4. Denies intent to harm self/others when asked. Denies A/VH. Patient denies any physical complaints when asked. No acute distress noted. Support and encouragement provided. Routine safety checks conducted according to facility protocol. Encouraged patient to notify staff if thoughts of harm toward self or others arise. Patient verbalize understanding and agreement. Will continue to monitor for safety.

## 2024-11-16 NOTE — ED Notes (Signed)
 Pt up and down during night to toilet.  Denies distress or discomfort when questioned.  In no apparent distress.  Safety maintained.
